# Patient Record
Sex: Male | Born: 1955 | Race: Black or African American | Hispanic: No | Marital: Married | Smoking: Former smoker
Health system: Southern US, Community
[De-identification: ages and names within clinical notes are randomized; demographics above are authoritative.]

## PROBLEM LIST (undated history)

## (undated) DIAGNOSIS — I10 Essential (primary) hypertension: Secondary | ICD-10-CM

## (undated) DIAGNOSIS — I5021 Acute systolic (congestive) heart failure: Secondary | ICD-10-CM

## (undated) DIAGNOSIS — Z7901 Long term (current) use of anticoagulants: Secondary | ICD-10-CM

## (undated) DIAGNOSIS — I4892 Unspecified atrial flutter: Secondary | ICD-10-CM

## (undated) HISTORY — DX: Unspecified atrial flutter: I48.92

## (undated) HISTORY — DX: Long term (current) use of anticoagulants: Z79.01

## (undated) HISTORY — DX: Acute systolic (congestive) heart failure: I50.21

---

## 2008-02-06 ENCOUNTER — Emergency Department (HOSPITAL_COMMUNITY): Admission: EM | Admit: 2008-02-06 | Discharge: 2008-02-06 | Payer: Self-pay | Admitting: Emergency Medicine

## 2008-02-18 ENCOUNTER — Emergency Department (HOSPITAL_COMMUNITY): Admission: EM | Admit: 2008-02-18 | Discharge: 2008-02-18 | Payer: Self-pay | Admitting: Emergency Medicine

## 2010-05-29 HISTORY — PX: FACIAL LACERATION REPAIR: SHX6589

## 2010-06-11 ENCOUNTER — Inpatient Hospital Stay (HOSPITAL_COMMUNITY)
Admission: EM | Admit: 2010-06-11 | Discharge: 2010-06-12 | Payer: Self-pay | Source: Home / Self Care | Attending: General Surgery | Admitting: General Surgery

## 2010-06-13 LAB — POCT I-STAT, CHEM 8
BUN: 18 mg/dL (ref 6–23)
Calcium, Ion: 0.99 mmol/L — ABNORMAL LOW (ref 1.12–1.32)
Chloride: 107 mEq/L (ref 96–112)
Creatinine, Ser: 1.6 mg/dL — ABNORMAL HIGH (ref 0.4–1.5)
Glucose, Bld: 114 mg/dL — ABNORMAL HIGH (ref 70–99)
HCT: 41 % (ref 39.0–52.0)
Hemoglobin: 13.9 g/dL (ref 13.0–17.0)
Potassium: 3.5 mEq/L (ref 3.5–5.1)
Sodium: 138 mEq/L (ref 135–145)
TCO2: 21 mmol/L (ref 0–100)

## 2010-06-13 LAB — BASIC METABOLIC PANEL
BUN: 10 mg/dL (ref 6–23)
CO2: 23 mEq/L (ref 19–32)
Calcium: 7.3 mg/dL — ABNORMAL LOW (ref 8.4–10.5)
Chloride: 110 mEq/L (ref 96–112)
Creatinine, Ser: 1.1 mg/dL (ref 0.4–1.5)
GFR calc Af Amer: >60 mL/min (ref >60)
GFR calc non Af Amer: >60 mL/min (ref >60)
Glucose, Bld: 102 mg/dL — ABNORMAL HIGH (ref 70–99)
Potassium: 3.6 mEq/L (ref 3.5–5.1)
Sodium: 137 mEq/L (ref 135–145)

## 2010-06-13 LAB — COMPREHENSIVE METABOLIC PANEL
ALT: 22 U/L (ref 0–53)
AST: 24 U/L (ref 0–37)
Albumin: 4.1 g/dL (ref 3.5–5.2)
Alkaline Phosphatase: 75 U/L (ref 39–117)
BUN: 16 mg/dL (ref 6–23)
CO2: 22 mEq/L (ref 19–32)
Calcium: 8.9 mg/dL (ref 8.4–10.5)
Chloride: 106 mEq/L (ref 96–112)
Creatinine, Ser: 1.23 mg/dL (ref 0.4–1.5)
GFR calc Af Amer: >60 mL/min (ref >60)
GFR calc non Af Amer: >60 mL/min (ref >60)
Glucose, Bld: 116 mg/dL — ABNORMAL HIGH (ref 70–99)
Potassium: 3.6 mEq/L (ref 3.5–5.1)
Sodium: 139 mEq/L (ref 135–145)
Total Bilirubin: 0.3 mg/dL (ref 0.3–1.2)
Total Protein: 7 g/dL (ref 6.0–8.3)

## 2010-06-13 LAB — CBC
HCT: 38.9 % — ABNORMAL LOW (ref 39.0–52.0)
HCT: 25.9 % — ABNORMAL LOW (ref 39.0–52.0)
Hemoglobin: 12.9 g/dL — ABNORMAL LOW (ref 13.0–17.0)
Hemoglobin: 8.8 g/dL — ABNORMAL LOW (ref 13.0–17.0)
MCH: 30.7 pg (ref 26.0–34.0)
MCH: 31.1 pg (ref 26.0–34.0)
MCHC: 33.2 g/dL (ref 30.0–36.0)
MCHC: 34 g/dL (ref 30.0–36.0)
MCV: 92.6 fL (ref 78.0–100.0)
MCV: 91.5 fL (ref 78.0–100.0)
Platelets: 353 10*3/uL (ref 150–400)
Platelets: 204 10*3/uL (ref 150–400)
RBC: 4.2 MIL/uL — ABNORMAL LOW (ref 4.22–5.81)
RBC: 2.83 MIL/uL — ABNORMAL LOW (ref 4.22–5.81)
RDW: 12.7 % (ref 11.5–15.5)
RDW: 13.1 % (ref 11.5–15.5)
WBC: 7.6 10*3/uL (ref 4.0–10.5)
WBC: 10.5 10*3/uL (ref 4.0–10.5)

## 2010-06-13 LAB — ABO/RH: ABO/RH(D): O POS

## 2010-06-13 LAB — HEMOGLOBIN AND HEMATOCRIT, BLOOD
HCT: 25 % — ABNORMAL LOW (ref 39.0–52.0)
Hemoglobin: 8.6 g/dL — ABNORMAL LOW (ref 13.0–17.0)

## 2010-06-13 LAB — PROTIME-INR
INR: 1.04 (ref 0.00–1.49)
Prothrombin Time: 13.8 seconds (ref 11.6–15.2)

## 2010-06-13 LAB — LACTIC ACID, PLASMA: Lactic Acid, Venous: 2.4 mmol/L — ABNORMAL HIGH (ref 0.5–2.2)

## 2010-06-15 LAB — TYPE AND SCREEN
ABO/RH(D): O POS
Antibody Screen: NEGATIVE
Unit division: 0
Unit division: 0
Unit division: 0

## 2010-06-20 LAB — POCT I-STAT 4, (NA,K, GLUC, HGB,HCT)
Glucose, Bld: 104 mg/dL — ABNORMAL HIGH (ref 70–99)
HCT: 26 % — ABNORMAL LOW (ref 39.0–52.0)
Hemoglobin: 8.8 g/dL — ABNORMAL LOW (ref 13.0–17.0)
Potassium: 3.5 mEq/L (ref 3.5–5.1)
Sodium: 141 mEq/L (ref 135–145)
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
## 2017-03-29 DIAGNOSIS — Z7901 Long term (current) use of anticoagulants: Secondary | ICD-10-CM

## 2017-03-29 DIAGNOSIS — I4892 Unspecified atrial flutter: Secondary | ICD-10-CM | POA: Insufficient documentation

## 2017-03-29 HISTORY — DX: Unspecified atrial flutter: I48.92

## 2017-03-29 HISTORY — DX: Long term (current) use of anticoagulants: Z79.01

## 2017-04-24 ENCOUNTER — Other Ambulatory Visit: Payer: Self-pay

## 2017-04-24 ENCOUNTER — Inpatient Hospital Stay (HOSPITAL_COMMUNITY)
Admission: EM | Admit: 2017-04-24 | Discharge: 2017-04-29 | DRG: 917 | Disposition: A | Discharge status: Home / Self Care | Payer: Self-pay | Attending: Internal Medicine | Admitting: Internal Medicine

## 2017-04-24 ENCOUNTER — Encounter (HOSPITAL_COMMUNITY): Payer: Self-pay

## 2017-04-24 ENCOUNTER — Emergency Department (HOSPITAL_COMMUNITY): Payer: Self-pay

## 2017-04-24 DIAGNOSIS — I4891 Unspecified atrial fibrillation: Secondary | ICD-10-CM | POA: Diagnosis present

## 2017-04-24 DIAGNOSIS — I161 Hypertensive emergency: Secondary | ICD-10-CM

## 2017-04-24 DIAGNOSIS — I5021 Acute systolic (congestive) heart failure: Secondary | ICD-10-CM

## 2017-04-24 DIAGNOSIS — T405X1A Poisoning by cocaine, accidental (unintentional), initial encounter: Principal | ICD-10-CM | POA: Diagnosis present

## 2017-04-24 DIAGNOSIS — N179 Acute kidney failure, unspecified: Secondary | ICD-10-CM | POA: Diagnosis present

## 2017-04-24 DIAGNOSIS — I21A1 Myocardial infarction type 2: Secondary | ICD-10-CM | POA: Diagnosis present

## 2017-04-24 DIAGNOSIS — I4892 Unspecified atrial flutter: Secondary | ICD-10-CM

## 2017-04-24 DIAGNOSIS — I248 Other forms of acute ischemic heart disease: Secondary | ICD-10-CM

## 2017-04-24 DIAGNOSIS — R0602 Shortness of breath: Secondary | ICD-10-CM

## 2017-04-24 DIAGNOSIS — I5043 Acute on chronic combined systolic (congestive) and diastolic (congestive) heart failure: Secondary | ICD-10-CM | POA: Diagnosis present

## 2017-04-24 DIAGNOSIS — I255 Ischemic cardiomyopathy: Secondary | ICD-10-CM | POA: Diagnosis present

## 2017-04-24 DIAGNOSIS — I110 Hypertensive heart disease with heart failure: Secondary | ICD-10-CM | POA: Diagnosis present

## 2017-04-24 DIAGNOSIS — I160 Hypertensive urgency: Secondary | ICD-10-CM | POA: Diagnosis present

## 2017-04-24 DIAGNOSIS — J9601 Acute respiratory failure with hypoxia: Secondary | ICD-10-CM | POA: Diagnosis present

## 2017-04-24 DIAGNOSIS — I214 Non-ST elevation (NSTEMI) myocardial infarction: Secondary | ICD-10-CM

## 2017-04-24 DIAGNOSIS — Z8249 Family history of ischemic heart disease and other diseases of the circulatory system: Secondary | ICD-10-CM

## 2017-04-24 DIAGNOSIS — R739 Hyperglycemia, unspecified: Secondary | ICD-10-CM | POA: Diagnosis present

## 2017-04-24 DIAGNOSIS — J810 Acute pulmonary edema: Secondary | ICD-10-CM

## 2017-04-24 DIAGNOSIS — I2510 Atherosclerotic heart disease of native coronary artery without angina pectoris: Secondary | ICD-10-CM | POA: Diagnosis present

## 2017-04-24 DIAGNOSIS — I429 Cardiomyopathy, unspecified: Secondary | ICD-10-CM

## 2017-04-24 DIAGNOSIS — I4581 Long QT syndrome: Secondary | ICD-10-CM | POA: Diagnosis present

## 2017-04-24 DIAGNOSIS — Q211 Atrial septal defect: Secondary | ICD-10-CM

## 2017-04-24 DIAGNOSIS — D649 Anemia, unspecified: Secondary | ICD-10-CM | POA: Diagnosis present

## 2017-04-24 DIAGNOSIS — J811 Chronic pulmonary edema: Secondary | ICD-10-CM

## 2017-04-24 DIAGNOSIS — F17210 Nicotine dependence, cigarettes, uncomplicated: Secondary | ICD-10-CM | POA: Diagnosis present

## 2017-04-24 DIAGNOSIS — F1410 Cocaine abuse, uncomplicated: Secondary | ICD-10-CM | POA: Diagnosis present

## 2017-04-24 HISTORY — DX: Essential (primary) hypertension: I10

## 2017-04-24 HISTORY — DX: Acute systolic (congestive) heart failure: I50.21

## 2017-04-24 LAB — URINALYSIS, ROUTINE W REFLEX MICROSCOPIC
Bilirubin Urine: NEGATIVE
Glucose, UA: NEGATIVE mg/dL
Hgb urine dipstick: NEGATIVE
KETONES UR: NEGATIVE mg/dL
LEUKOCYTES UA: NEGATIVE
NITRITE: NEGATIVE
PROTEIN: NEGATIVE mg/dL
Specific Gravity, Urine: 1.009 (ref 1.005–1.030)
pH: 5 (ref 5.0–8.0)

## 2017-04-24 LAB — BASIC METABOLIC PANEL
Anion gap: 7 (ref 5–15)
BUN: 17 mg/dL (ref 6–20)
CO2: 22 mmol/L (ref 22–32)
Calcium: 8.8 mg/dL — ABNORMAL LOW (ref 8.9–10.3)
Chloride: 109 mmol/L (ref 101–111)
Creatinine, Ser: 1.26 mg/dL — ABNORMAL HIGH (ref 0.61–1.24)
GFR calc Af Amer: >60 mL/min (ref >60)
GFR calc non Af Amer: >60 mL/min (ref >60)
Glucose, Bld: 131 mg/dL — ABNORMAL HIGH (ref 65–99)
Potassium: 3.7 mmol/L (ref 3.5–5.1)
Sodium: 138 mmol/L (ref 135–145)

## 2017-04-24 LAB — RAPID URINE DRUG SCREEN, HOSP PERFORMED
Amphetamines: NOT DETECTED
BENZODIAZEPINES: NOT DETECTED
Barbiturates: NOT DETECTED
COCAINE: POSITIVE — AB
OPIATES: NOT DETECTED
Tetrahydrocannabinol: NOT DETECTED

## 2017-04-24 LAB — I-STAT TROPONIN, ED: Troponin i, poc: 1.18 ng/mL (ref 0.00–0.08)

## 2017-04-24 LAB — HEPATIC FUNCTION PANEL
ALT: 28 U/L (ref 17–63)
AST: 34 U/L (ref 15–41)
Albumin: 3.3 g/dL — ABNORMAL LOW (ref 3.5–5.0)
Alkaline Phosphatase: 73 U/L (ref 38–126)
BILIRUBIN INDIRECT: 0.5 mg/dL (ref 0.3–0.9)
Bilirubin, Direct: 0.1 mg/dL (ref 0.1–0.5)
TOTAL PROTEIN: 6.6 g/dL (ref 6.5–8.1)
Total Bilirubin: 0.6 mg/dL (ref 0.3–1.2)

## 2017-04-24 LAB — CBC
HCT: 37.1 % — ABNORMAL LOW (ref 39.0–52.0)
Hemoglobin: 12.3 g/dL — ABNORMAL LOW (ref 13.0–17.0)
MCH: 30.7 pg (ref 26.0–34.0)
MCHC: 33.2 g/dL (ref 30.0–36.0)
MCV: 92.5 fL (ref 78.0–100.0)
Platelets: 459 10*3/uL — ABNORMAL HIGH (ref 150–400)
RBC: 4.01 MIL/uL — ABNORMAL LOW (ref 4.22–5.81)
RDW: 12.9 % (ref 11.5–15.5)
WBC: 9.5 10*3/uL (ref 4.0–10.5)

## 2017-04-24 LAB — TSH: TSH: 0.564 u[IU]/mL (ref 0.350–4.500)

## 2017-04-24 LAB — MAGNESIUM: Magnesium: 2 mg/dL (ref 1.7–2.4)

## 2017-04-24 LAB — BRAIN NATRIURETIC PEPTIDE: B NATRIURETIC PEPTIDE 5: 396.4 pg/mL — AB (ref 0.0–100.0)

## 2017-04-24 MED ORDER — HEPARIN (PORCINE) IN NACL 100-0.45 UNIT/ML-% IJ SOLN
1700.0000 [IU]/h | INTRAMUSCULAR | Status: DC
  Administered 2017-04-24: 1300 [IU]/h via INTRAVENOUS
  Administered 2017-04-25: 1600 [IU]/h via INTRAVENOUS
  Filled 2017-04-24 (×3): qty 250

## 2017-04-24 MED ORDER — FUROSEMIDE 10 MG/ML IJ SOLN
20.0000 mg | Freq: Once | INTRAMUSCULAR | Status: AC
  Administered 2017-04-25: 20 mg via INTRAVENOUS
  Filled 2017-04-24: qty 2

## 2017-04-24 MED ORDER — ALBUTEROL SULFATE (2.5 MG/3ML) 0.083% IN NEBU
5.0000 mg | INHALATION_SOLUTION | Freq: Once | RESPIRATORY_TRACT | Status: AC
Start: 2017-04-24 — End: 2017-04-24
  Administered 2017-04-24: 5 mg via RESPIRATORY_TRACT
  Filled 2017-04-24: qty 6

## 2017-04-24 MED ORDER — FUROSEMIDE 10 MG/ML IJ SOLN
20.0000 mg | Freq: Once | INTRAMUSCULAR | Status: AC
  Administered 2017-04-24: 20 mg via INTRAVENOUS
  Filled 2017-04-24: qty 2

## 2017-04-24 MED ORDER — NITROGLYCERIN 2 % TD OINT
1.0000 [in_us] | TOPICAL_OINTMENT | Freq: Four times a day (QID) | TRANSDERMAL | Status: DC
  Administered 2017-04-24: 1 [in_us] via TOPICAL
  Filled 2017-04-24: qty 1

## 2017-04-24 MED ORDER — DILTIAZEM LOAD VIA INFUSION
15.0000 mg | Freq: Once | INTRAVENOUS | Status: AC
  Administered 2017-04-24: 15 mg via INTRAVENOUS
  Filled 2017-04-24: qty 15

## 2017-04-24 MED ORDER — ASPIRIN 81 MG PO CHEW
324.0000 mg | CHEWABLE_TABLET | Freq: Once | ORAL | Status: DC
  Filled 2017-04-24: qty 4

## 2017-04-24 MED ORDER — LEVALBUTEROL HCL 0.63 MG/3ML IN NEBU: 0.6300 mg | INHALATION_SOLUTION | Freq: Four times a day (QID) | RESPIRATORY_TRACT | Status: DC | PRN

## 2017-04-24 MED ORDER — NITROGLYCERIN IN D5W 200-5 MCG/ML-% IV SOLN
0.0000 ug/min | Freq: Once | INTRAVENOUS | Status: AC
  Administered 2017-04-24: 10 ug/min via INTRAVENOUS
  Filled 2017-04-24: qty 250

## 2017-04-24 MED ORDER — DILTIAZEM HCL 100 MG IV SOLR
5.0000 mg/h | INTRAVENOUS | Status: DC
  Administered 2017-04-24: 5 mg/h via INTRAVENOUS
  Filled 2017-04-24 (×4): qty 100

## 2017-04-24 MED ORDER — HEPARIN BOLUS VIA INFUSION
5000.0000 [IU] | Freq: Once | INTRAVENOUS | Status: AC
  Administered 2017-04-24: 5000 [IU] via INTRAVENOUS
  Filled 2017-04-24: qty 5000

## 2017-04-24 NOTE — ED Notes (Signed)
Message sent to pharmacy for Cardizem

## 2017-04-24 NOTE — ED Notes (Signed)
Lab to add on Magnesium 

## 2017-04-24 NOTE — ED Notes (Signed)
Writer notified EPD Pfeiffer of abnormal I-stat trop result.

## 2017-04-24 NOTE — ED Notes (Signed)
Updated Burna Mortimer, pt's wife, on plan of care per permission of pt

## 2017-04-24 NOTE — H&P (Signed)
PULMONARY / CRITICAL CARE MEDICINE   Name: Roger Humphrey: [id number] DOB: 03/28/1956    ADMISSION DATE:  04/24/2017 CONSULTATION DATE:  04/24/2017  REFERRING MD:  Dr. Donnald GarrePfeiffer  CHIEF COMPLAINT:  SOB/HTN crisis  HISTORY OF PRESENT ILLNESS:   Roger Humphrey is a 61 year old male with no known past medical history other than tobacco abuse.  He has not seen a physician in "years".  Presented to the ER on 11/27 with worsening shortness of breath since last night.    He reports that he socially drinks beer and usually does not do illicit drugs but smoked marijuana on Thanksgiving night.  He said that his friends did not tell him till afterwards that it was laced with cocaine.  Since, he has felt palpitations noticed that he was could not catch his breath laying down last night with occasional dry cough.  Today it continued, along with intermittent "cramps" in his left upper chest/axilla area, sweats, and acutely worsened after going to [employer].  Denies fever, chills, productive cough or hemoptysis, lower extremity swelling or pain, or syncope.  He was found to be in Aflutter with acute respiratory distress per EMS and treated with cardizem, albuterol/atrovent, and noted to be 85% on room air.  In the ER, BP 176/110, aflutter with rate 120's, ongoing severe dyspnea with rales requiring NIMV.  CXR consistent with acute pulmonary edema.  EKG showing aflutter.  Labs noted for BNP 396, troponin 1.18, sCr 1.26, and UDS + cocaine.  Cardiology consulted by EDP and started on NTG, heparin, and Cardizem gtt.  PCCM asked to admit.   PAST MEDICAL HISTORY :  He  has no past medical history on file.  PAST SURGICAL HISTORY: He  has no past surgical history on file.  No Known Allergies  No current facility-administered medications on file prior to encounter.    Current Outpatient Medications on File Prior to Encounter  Medication Sig  . Multiple Vitamin (MULTIVITAMIN WITH MINERALS) TABS tablet Take 1  tablet by mouth daily.    FAMILY HISTORY:  His has no family status information on file.    SOCIAL HISTORY: He  reports that he has been smoking.  He does not have any smokeless tobacco history on file.  Smokes 2 cigarettes a day for at least 36 years  REVIEW OF SYSTEMS:  POSITIVES IN BOLD Gen: Denies fever, chills, weight change, fatigue, night sweats HEENT: Denies blurred vision, double vision, hearing loss, tinnitus, sinus congestion, rhinorrhea, sore throat, neck stiffness, dysphagia PULM: Denies shortness of breath, dry cough, sputum production, hemoptysis, wheezing CV: Denies chest pain, edema, orthopnea, paroxysmal nocturnal dyspnea, palpitations, diaphoresis GI: Denies abdominal pain, nausea, vomiting, diarrhea, hematochezia, melena, constipation, change in bowel habits GU: Denies dysuria, hematuria, polyuria, oliguria, urethral discharge Endocrine: Denies hot or cold intolerance, polyuria, polyphagia or appetite change Derm: Denies rash, dry skin, scaling or peeling skin change Heme: Denies easy bruising, bleeding, bleeding gums Neuro: Denies headache, numbness, weakness, slurred speech, loss of memory or consciousness   SUBJECTIVE:  No complaints at present.  Feels better on BiPAP.    VITAL SIGNS: BP (!) 168/106   Pulse (!) 117   Temp 98.7 F (37.1 C) (Oral)   Resp (!) 30   Ht 5\' 8"  (1.727 m)   Wt 214 lb (97.1 kg)   SpO2 98%   BMI 32.54 kg/m   HEMODYNAMICS:    VENTILATOR SETTINGS: FiO2 (%):  [50 %] 50 %  INTAKE / OUTPUT: No intake/output data recorded.  PHYSICAL EXAMINATION: General:  Adult male sitting upright on ER stretcher in no acute distress HEENT: +JVD, pupils 3/=/round Neuro: AOx 3, MAE, non-focal CV:  IRIR 120's, +2 warm distal pulses PULM: even/non-labored on NIMV 10/5, 50%, lungs bilaterally clear anteriorly, posterior bibasilar rales R> L, speaking full sentences GI: obese, soft, non-tender, bs active  Extremities: warm/dry, trace BLE edema   Skin: no rashes  LABS:  BMET Recent Labs  Lab 04/24/17 2011  NA 138  K 3.7  CL 109  CO2 22  BUN 17  CREATININE 1.26*  GLUCOSE 131*    Electrolytes Recent Labs  Lab 04/24/17 2011  CALCIUM 8.8*  MG 2.0    CBC Recent Labs  Lab 04/24/17 2011  WBC 9.5  HGB 12.3*  HCT 37.1*  PLT 459*    Coag's No results for input(s): APTT, INR in the last 168 hours.  Sepsis Markers No results for input(s): LATICACIDVEN, PROCALCITON, O2SATVEN in the last 168 hours.  ABG No results for input(s): PHART, PCO2ART, PO2ART in the last 168 hours.  Liver Enzymes Recent Labs  Lab 04/24/17 2011  AST 34  ALT 28  ALKPHOS 73  BILITOT 0.6  ALBUMIN 3.3*    Cardiac Enzymes No results for input(s): TROPONINI, PROBNP in the last 168 hours.  Glucose No results for input(s): GLUCAP in the last 168 hours.  Imaging Dg Chest Port 1 View  Result Date: 04/24/2017 CLINICAL DATA:  Severe shortness of breath. EXAM: PORTABLE CHEST 1 VIEW COMPARISON:  Chest x-ray dated June 11, 2010. FINDINGS: Mild cardiomegaly. Normal pulmonary vascularity. Diffuse consolidative opacities in the right greater than left lungs. Small right pleural effusion. No pneumothorax. No acute osseous abnormality. IMPRESSION: 1. Diffuse consolidative opacities in the right greater than left lungs, which could reflect ARDS, edema, or hemorrhage. Electronically Signed   By: Obie Dredge M.D.   On: 04/24/2017 20:43   STUDIES:  CXR 04/24/2017 >> Diffuse consolidative opacities in the right greater than left lungs, which could reflect ARDS, edema, or hemorrhage  EKG 11/27 >> Aflutter, rate 130, prolonged QTc .481, probable LVH  CULTURES: MRSA PCR 11/27 >>  ANTIBIOTICS: none  SIGNIFICANT EVENTS: 11/27 Admit  LINES/TUBES: PIV x 2  DISCUSSION: 25 yoM with no known PMH except tobacco abuse but doesnt see a doctor presenting with severe SOB- progressed since last night, palpitations, diaphoresis, and intermittent  CP.  Smoked marijuana on Thanksgiving and did not know it was laced with cocaine.  Found hypertensive, Aflutter 130's, and hypoxic requiring NIMV, NGT and Cardizem gtt, and lasix.  Cards consulting.    ASSESSMENT / PLAN:  PULMONARY A: Acute hypoxic respiratory failure secondary to acute pulmonary edema Tobacco abuse  P:   Continue NIMV for now, trial off in 4 hrs and then PRN S/p lasix 20 mg On NTG gtt for BP Wean supplemental O2 for sats >94% xopenex q 6 hr prn Ongoing tobacco cessation counseling  CARDIOVASCULAR A:  Hypertensive Emergency with acute pulmonary edema and new onset Aflutter with positive troponin + Cocaine use - BNP 396, troponin 1.18 P:  Tele monitoring Appreciate Cardiology's assistance Heparin, NTG, and Cardizem gtt per Cards Assess TTE when rate is better controlled Trend troponin and EKG No beta blocker with recent cocaine use Assess Mg level  RENAL A:   AKI- previous sCr 1.10 (05/2010) P:   S/p Lasix 20mg  x 1, repeat at 0800 Trend BMP /mag/  urinary output/ daily weights Replace electrolytes as indicated Avoid nephrotoxic agents, ensure adequate renal perfusion  GASTROINTESTINAL A:   No acute issues - LFTs wnl P:   NPO while on NIMV Does not meet SUP criteria  HEMATOLOGIC A:   Anemia- mild P:  Trend CBC Systemic heparin gtt as above, SCDs for VTE ppx  INFECTIOUS A:   No known acute process - afebrile, normal WBC P:   Trend WBC/ fever curve  ENDOCRINE A:   Hyperglycemia- mild - TSH 0.564 P:   CBG q 4 Add SSI if glucose > 150 Assess HgbA1c  NEUROLOGIC A:   No acute issues, non-focal neuro exam - ETOH use- socially P:   Monitor  FAMILY  - Updates:  No family at bedside.  Patient updated concerning plan of care.   - Inter-disciplinary family meet or Palliative Care meeting due by:  05/01/2017  CCT 40 mins  Posey Boyer, AGACNP-BC Pulmonary and Critical Care Medicine [facility] Pager: [phone]  04/24/2017, 11:11 PM

## 2017-04-24 NOTE — ED Provider Notes (Signed)
[facility] EMERGENCY DEPARTMENT Provider Note   CSN: [id number] Arrival date & time: 04/24/17  2001     History   Chief Complaint Chief Complaint  Patient presents with  . Shortness of Breath    HPI Roger Humphrey is a 61 y.o. male.  HPI Patient denies past medical history.  He does have smoking history.  He reports he started getting short of breath fairly quickly this morning at about 5 AM.  He reports he has been having some waxing and waning feeling of tightness in his left upper chest.  He reports that the difficulty breathing got much worse after he went out and did some errands.  He reports he got to the point that he felt like he just could not catch his breath at all.  Denies he has had recent fever, cough or chills.  No lower extremity swelling or calf pain.  No recent long travel or history of DVT.  She reports he is last cigarette was 72 hours ago.  No drug or alcohol use. No past medical history on file.  There are no active problems to display for this patient.   Past medical history No known past medical history  Social history: Positive tobacco use, positive cocaine use     Home Medications    Prior to Admission medications   Not on File    Family History No family history on file.  Social History Social History   Tobacco Use  . Smoking status: Current Every Day Smoker  Substance Use Topics  . Alcohol use: Not on file  . Drug use: Not on file     Allergies   Patient has no allergy information on record.   Review of Systems Review of Systems 10 Systems reviewed and are negative for acute change except as noted in the HPI.   Physical Exam Updated Vital Signs BP (!) 185/123   Pulse (!) 123   Temp 98.7 F (37.1 C) (Oral)   Resp (!) 38   Ht 5\' 8"  (1.727 m)   Wt 97.1 kg (214 lb)   SpO2 100%   BMI 32.54 kg/m   Physical Exam  Constitutional: He is oriented to person, place, and time.  Patient is alert and  oriented.  He does have moderate to severe respiratory distress.  Speaking in short full sentences.  HENT:  Head: Normocephalic and atraumatic.  Eyes: EOM are normal.  Cardiovascular:  Tachycardia.  No gross rub murmur gallop.  Pulmonary/Chest:  Moderate to severe increased work of breathing.  Speaking in short full sentences.  Coarse wheeze and rales throughout the lung fields.  Abdominal: Soft. He exhibits no distension. There is no tenderness. There is no guarding.  Musculoskeletal: Normal range of motion. He exhibits no edema or tenderness.  No peripheral edema.  No calf tenderness.  Neurological: He is alert and oriented to person, place, and time. No cranial nerve deficit. He exhibits normal muscle tone. Coordination normal.  Skin: Skin is warm.  Warm, slightly diaphoretic  Psychiatric: He has a normal mood and affect.     ED Treatments / Results  Labs (all labs ordered are listed, but only abnormal results are displayed) Labs Reviewed  CBC - Abnormal; Notable for the following components:      Result Value   RBC 4.01 (*)    Hemoglobin 12.3 (*)    HCT 37.1 (*)    Platelets 459 (*)    All other components within normal limits  I-STAT TROPONIN, ED - Abnormal; Notable for the following components:   Troponin i, poc 1.18 (*)    All other components within normal limits  BASIC METABOLIC PANEL  HEPATIC FUNCTION PANEL  BRAIN NATRIURETIC PEPTIDE  URINALYSIS, ROUTINE W REFLEX MICROSCOPIC  RAPID URINE DRUG SCREEN, HOSP PERFORMED  TSH    EKG  EKG Interpretation  Date/Time:  Tuesday April 24 2017 20:06:33 EST Ventricular Rate:  130 PR Interval:    QRS Duration: 93 QT Interval:  327 QTC Calculation: 481 R Axis:   76 Text Interpretation:  Atrial flutter Probable LVH with secondary repol abnrm Borderline prolonged QT interval Baseline wander in lead(s) II III aVF V2 agree. Confirmed by Arby BarrettePfeiffer, Demetruis Depaul [phone]([id number]) on 04/24/2017 8:15:42 PM       Radiology No results  found.  Procedures Procedures (including critical care time) CRITICAL CARE Performed by: Arby BarrettePfeiffer, Aviyah Swetz   Total critical care time:45 minutes  Critical care time was exclusive of separately billable procedures and treating other patients.  Critical care was necessary to treat or prevent imminent or life-threatening deterioration.  Critical care was time spent personally by me on the following activities: development of treatment plan with patient and/or surrogate as well as nursing, discussions with consultants, evaluation of patient's response to treatment, examination of patient, obtaining history from patient or surrogate, ordering and performing treatments and interventions, ordering and review of laboratory studies, ordering and review of radiographic studies, pulse oximetry and re-evaluation of patient's condition. Medications Ordered in ED Medications  albuterol (PROVENTIL) (2.5 MG/3ML) 0.083% nebulizer solution 5 mg (not administered)  aspirin chewable tablet 324 mg (not administered)  nitroGLYCERIN (NITROGLYN) 2 % ointment 1 inch (not administered)  furosemide (LASIX) injection 20 mg (not administered)     Initial Impression / Assessment and Plan / ED Course  I have reviewed the triage vital signs and the nursing notes.  Pertinent labs & imaging results that were available during my care of the patient were reviewed by me and considered in my medical decision making (see chart for details).    Recheck 20: 20 patient comfortable on BiPAP and improved.  Recheck 22: 40 nitroglycerin drip has been titrated to 25 mics.  Pressure is 150/ 90s.  Patient reports he feels much better.  No chest pain.  Patient still on BiPAP.  Speaks clearly through the BiPAP mask.  Complete sentences without difficulty. Consult: Has been seen by cardiology fellow Dr. Electa SniffBarnett.  Suspects troponin is more likely demand ischemia.  Agrees with initiating heparin for atrial fib flutter.  Continue to  titrate nitroglycerin for blood pressure. Consult: Reviewed with Dr. Darrick Pennaeterding for admission.  Final Clinical Impressions(s) / ED Diagnoses   Final diagnoses:  Flash pulmonary edema (HCC)  Atrial flutter, unspecified type [facility])   Patient presents as outlined above.  He denies past medical history.  Findings most consistent with flash pulmonary edema.  Patient was significantly hypertensive.  Hypertensive emergency versus cardiac ischemia as the underlying etiology.  Patient also is cocaine positive.  May be a contributor.  Patient has responded well to treatment.  She will be admitted to ICU for ongoing management with consultation to cardiology. ED Discharge Orders    None       Arby BarrettePfeiffer, Melis Trochez, MD 04/24/17 2246

## 2017-04-24 NOTE — ED Notes (Signed)
Cardiologist at bedside.  

## 2017-04-24 NOTE — Consult Note (Signed)
Cardiology Consultation:   Patient ID: Roger Humphrey; [id number]; 12-20-1955   Admit date: 04/24/2017 Date of Consult: 04/24/2017  Primary Care Provider: Patient, No Pcp Per Primary Cardiologist: n/a Primary Electrophysiologist:  n/a  Chief Complaint: shortness of breath  Patient Profile:   Roger Humphrey is a 61 y.o. male with no past medical history who presents with shortness of breath.  History of Present Illness:   Patient states he has been feeling fine until last night when he trouble breathing and felt nauseous.  He then felt better after going outside in the cool air.  Then today he had shortness of breath off and on all day.  He eventually got to the point where he felt like he could not breathe and came to the emergency room.  He denies any chest pain or pressure with the symptoms.  In the emergency room he was noted to have a blood pressure of 190/121.  He was also found to be in atrial flutter/fibrillation with a heart rate in the 120s.  EKG also notable for T wave inversions in V5 and V6.  Chest x-ray demonstrated pulmonary edema.  Labs are notable for a troponin of 1.18 BNP of 396 and creatinine of 1.26.    He was placed on BiPAP and given 20 mg IV Lasix.  Nitroglycerin infusion was started for hypertensive emergency.  He improved with these measures.  He denies having any prior medical problems.  He does not follow with a doctor regularly.  He takes no medications.  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  He denies any symptoms climbing up ladders and going up stairs over the past several weeks.  Specifically he denies any chest pain or pressure.  He does admit to occasionally using marijuana and cocaine. He last used cocaine on Thanksgiving.    Past medical History None  Past Surgical History None  Family History No relevant family history  Social history He is a smoker and smokes about 1 pack every 2 weeks since the age of 53.  Uses  marijuana and cocaine.  Drinks 4 beers per week.  Is married and lives with his wife.  Works as a Education administrator.   Inpatient Medications: Scheduled Meds: . aspirin  324 mg Oral Once   Continuous Infusions: . diltiazem (CARDIZEM) infusion 5 mg/hr (04/24/17 2326)  . heparin 1,300 Units/hr (04/24/17 2219)   PRN Meds:   Home Meds: Prior to Admission medications   Medication Sig Start Date End Date Taking? Authorizing Provider  Multiple Vitamin (MULTIVITAMIN WITH MINERALS) TABS tablet Take 1 tablet by mouth daily.   Yes [provider]    Allergies:   No Known Allergies  Social History:   Social History   Socioeconomic History  . Marital status: Married    Spouse name: Not on file  . Number of children: Not on file  . Years of education: Not on file  . Highest education level: Not on file  Social Needs  . Financial resource strain: Not on file  . Food insecurity - worry: Not on file  . Food insecurity - inability: Not on file  . Transportation needs - medical: Not on file  . Transportation needs - non-medical: Not on file  Occupational History  . Not on file  Tobacco Use  . Smoking status: Current Every Day Smoker  Substance and Sexual Activity  . Alcohol use: Not on file  . Drug use: Not on file  . Sexual activity:  Not on file  Other Topics Concern  . Not on file  Social History Narrative  . Not on file     ROS:  Please see the history of present illness.  All other ROS reviewed and negative.     Physical Exam/Data:   Vitals:   04/24/17 2230 04/24/17 2245 04/24/17 2300 04/24/17 2315  BP: (!) 151/109 (!) 154/117 (!) 168/106 (!) 143/99  Pulse: (!) 57 (!) 113 (!) 117 (!) 116  Resp: (!) 29 (!) 29 (!) 30 (!) 22  Temp:      TempSrc:      SpO2: 98% 100% 98% 100%  Weight:      Height:        Intake/Output Summary (Last 24 hours) at 04/24/2017 2331 Last data filed at 04/24/2017 2204 Gross per 24 hour  Intake -  Output 1200 ml  Net -1200 ml   Filed  Weights   04/24/17 2008  Weight: 97.1 kg (214 lb)   Body mass index is 32.54 kg/m.  General: Well developed, well nourished, in no acute distress. Head: Normocephalic, atraumatic, sclera non-icteric, no xanthomas, nares are without discharge.  Neck: Negative for carotid bruits. JVD not elevated. Lungs: Clear bilaterally to auscultation without wheezes, rales, or rhonchi. Breathing is unlabored. Heart: RRR with S1 S2. No murmurs, rubs, or gallops appreciated. Abdomen: Soft, non-tender, non-distended with normoactive bowel sounds. No hepatomegaly. No rebound/guarding. No obvious abdominal masses. Msk:  Strength and tone appear normal for age. Extremities: No clubbing or cyanosis. No edema.  Distal pedal pulses are 2+ and equal bilaterally. Neuro: Alert and oriented X 3. No facial asymmetry. No focal deficit. Moves all extremities spontaneously. Psych:  Responds to questions appropriately with a normal affect.  EKG:  The EKG was personally reviewed and demonstrates afib/aflutter.  Relevant CV Studies: None  Laboratory Data:  Chemistry Recent Labs  Lab 04/24/17 2011  NA 138  K 3.7  CL 109  CO2 22  GLUCOSE 131*  BUN 17  CREATININE 1.26*  CALCIUM 8.8*  GFRNONAA >60  GFRAA >60  ANIONGAP 7    Recent Labs  Lab 04/24/17 2011  PROT 6.6  ALBUMIN 3.3*  AST 34  ALT 28  ALKPHOS 73  BILITOT 0.6   Hematology Recent Labs  Lab 04/24/17 2011  WBC 9.5  RBC 4.01*  HGB 12.3*  HCT 37.1*  MCV 92.5  MCH 30.7  MCHC 33.2  RDW 12.9  PLT 459*   Cardiac EnzymesNo results for input(s): TROPONINI in the last 168 hours.  Recent Labs  Lab 04/24/17 2024  TROPIPOC 1.18*    BNP Recent Labs  Lab 04/24/17 2011  BNP 396.4*    DDimer No results for input(s): DDIMER in the last 168 hours.  Radiology/Studies:  Dg Chest Port 1 View  Result Date: 04/24/2017 CLINICAL DATA:  Severe shortness of breath. EXAM: PORTABLE CHEST 1 VIEW COMPARISON:  Chest x-ray dated June 11, 2010.  FINDINGS: Mild cardiomegaly. Normal pulmonary vascularity. Diffuse consolidative opacities in the right greater than left lungs. Small right pleural effusion. No pneumothorax. No acute osseous abnormality. IMPRESSION: 1. Diffuse consolidative opacities in the right greater than left lungs, which could reflect ARDS, edema, or hemorrhage. Electronically Signed   By: Obie Dredge M.D.   On: 04/24/2017 20:43    Assessment and Plan:   1. Hypertensive Emergency Patient to be admitted by medicinefor further management of hypertensive emergency with pulmonary edema.  2. Atrial fibrillation vs flutter EKG is suggestive of flutter with flutter waves  in the inferior leads.  However the RR intervals are fairly irregular and this may actually be A. Fib.  Regardless would focus on managing his pulmonary edema and hypertension for the time being his heart rates are in the 120s and would not aggressively rate control him.  I suspect his rates will improve as his respiratory status improves.  Can consider a diltiazem infusion if additional rate control as needed.  Low suspicion for cardiogenic shock.  If he remains in this rhythm after he has recovered from his respiratory failure we can consider cardioversion prior to discharge.  Agree with starting heparin to provide anticoagulation in the event that he does need cardioversion.  - Heparin infusion - Can consider diltiazem infusion for rate control with heart rate goal of less than 110. -will consider TEE cardioversion prior to discharge  3. NSTEMI, likely type II Noted to have positive troponin without any chest pain.  Suspect his elevated troponin is due to demand ischemia in the setting of severe hypertension and atrial fibrillation.  He does have some T wave inversions on his EKG which may suggest subendocardial ischemia.    Would get an echocardiogram tomorrow to evaluate his ejection fraction.  If his EF is severely depressed we can consider an ischemic  evaluation.  - Echocardiogram -We will consider cath if EF is severely depressed  4.  Cocaine use Avoid beta-blockers if possible   For questions or updates, please contact [facility] Please consult [email or web address] for contact info under Cardiology/STEMI.    Allen DerrySigned, Buzz Axel S, MD  04/24/2017 11:31 PM

## 2017-04-24 NOTE — Progress Notes (Signed)
ANTICOAGULATION CONSULT NOTE -Consult  Pharmacy Consult for heparin Indication: atrial fibrillation  No Known Allergies  Patient Measurements: Height: 5\' 8"  (172.7 cm) Weight: 214 lb (97.1 kg) IBW/kg (Calculated) : 68.4 Heparin Dosing Weight: 89 kg  Vital Signs: Temp: 98.7 F (37.1 C) (11/27 2012) Temp Source: Oral (11/27 2012) BP: 176/110 (11/27 2100) Pulse Rate: 122 (11/27 2100)  Labs: Recent Labs    04/24/17 2011  HGB 12.3*  HCT 37.1*  PLT 459*  CREATININE 1.26*    Estimated Creatinine Clearance: 70.5 mL/min (A) (by C-G formula based on SCr of 1.26 mg/dL (H)).  Assessment: CC/HPI: 61 yo m presenting with SOB and aflutter  PMH: none?  Anticoag: none pta - iv hep for afib   Renal: SCr 1.26  Heme/Onc: H&H 12.3/37.1, Plt 459  Goal of Therapy:  Heparin level 0.3-0.7 units/ml Monitor platelets by anticoagulation protocol: Yes   Plan:  Heparin bolus 5000 units x 1 Hep gtt 1300 units/hr Initial HL with am labs F/u plans for [facility]  Isaac Bliss, PharmD, BCPS, BCCCP Clinical Pharmacist Clinical phone for 04/24/2017 from 7a-3:30p: [phone] If after 3:30p, please call main pharmacy at: [phone] 04/24/2017 9:55 PM

## 2017-04-24 NOTE — ED Notes (Signed)
Respiratory at bedside.

## 2017-04-24 NOTE — ED Notes (Signed)
Nitro paste removed; nitro gtt started

## 2017-04-24 NOTE — ED Notes (Signed)
Requested additional cardizem bag from pharmacy; accidentally punctured bag when hanging medication
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
## 2017-04-25 ENCOUNTER — Inpatient Hospital Stay (HOSPITAL_COMMUNITY): Payer: Self-pay

## 2017-04-25 DIAGNOSIS — I248 Other forms of acute ischemic heart disease: Secondary | ICD-10-CM

## 2017-04-25 DIAGNOSIS — N179 Acute kidney failure, unspecified: Secondary | ICD-10-CM

## 2017-04-25 DIAGNOSIS — F1410 Cocaine abuse, uncomplicated: Secondary | ICD-10-CM

## 2017-04-25 DIAGNOSIS — I483 Typical atrial flutter: Secondary | ICD-10-CM

## 2017-04-25 DIAGNOSIS — I160 Hypertensive urgency: Secondary | ICD-10-CM

## 2017-04-25 DIAGNOSIS — J9601 Acute respiratory failure with hypoxia: Secondary | ICD-10-CM

## 2017-04-25 DIAGNOSIS — I5031 Acute diastolic (congestive) heart failure: Secondary | ICD-10-CM

## 2017-04-25 DIAGNOSIS — J810 Acute pulmonary edema: Secondary | ICD-10-CM | POA: Insufficient documentation

## 2017-04-25 LAB — GLUCOSE, CAPILLARY
GLUCOSE-CAPILLARY: 112 mg/dL — AB (ref 65–99)
GLUCOSE-CAPILLARY: 131 mg/dL — AB (ref 65–99)
GLUCOSE-CAPILLARY: 96 mg/dL (ref 65–99)
GLUCOSE-CAPILLARY: 97 mg/dL (ref 65–99)
Glucose-Capillary: 106 mg/dL — ABNORMAL HIGH (ref 65–99)
Glucose-Capillary: 97 mg/dL (ref 65–99)
Glucose-Capillary: 103 mg/dL — ABNORMAL HIGH (ref 65–99)

## 2017-04-25 LAB — HEPARIN LEVEL (UNFRACTIONATED)
HEPARIN UNFRACTIONATED: 0.2 [IU]/mL — AB (ref 0.30–0.70)
Heparin Unfractionated: 0.29 IU/mL — ABNORMAL LOW (ref 0.30–0.70)

## 2017-04-25 LAB — RENAL FUNCTION PANEL
ANION GAP: 9 (ref 5–15)
Albumin: 3.2 g/dL — ABNORMAL LOW (ref 3.5–5.0)
BUN: 14 mg/dL (ref 6–20)
CALCIUM: 8.6 mg/dL — AB (ref 8.9–10.3)
CO2: 22 mmol/L (ref 22–32)
CREATININE: 1.18 mg/dL (ref 0.61–1.24)
Chloride: 110 mmol/L (ref 101–111)
Glucose, Bld: 114 mg/dL — ABNORMAL HIGH (ref 65–99)
Phosphorus: 4.9 mg/dL — ABNORMAL HIGH (ref 2.5–4.6)
Potassium: 3.8 mmol/L (ref 3.5–5.1)
SODIUM: 141 mmol/L (ref 135–145)

## 2017-04-25 LAB — CBC
HEMATOCRIT: 34.1 % — AB (ref 39.0–52.0)
Hemoglobin: 11.4 g/dL — ABNORMAL LOW (ref 13.0–17.0)
MCH: 30.5 pg (ref 26.0–34.0)
MCHC: 33.4 g/dL (ref 30.0–36.0)
MCV: 91.2 fL (ref 78.0–100.0)
PLATELETS: 392 10*3/uL (ref 150–400)
RBC: 3.74 MIL/uL — ABNORMAL LOW (ref 4.22–5.81)
RDW: 12.7 % (ref 11.5–15.5)
WBC: 9.6 10*3/uL (ref 4.0–10.5)

## 2017-04-25 LAB — TROPONIN I
TROPONIN I: 1.15 ng/mL — AB (ref <0.03)
TROPONIN I: 1.82 ng/mL — AB (ref <0.03)
TROPONIN I: 1.57 ng/mL — AB (ref <0.03)

## 2017-04-25 LAB — HIV ANTIBODY (ROUTINE TESTING W REFLEX): HIV Screen 4th Generation wRfx: NONREACTIVE

## 2017-04-25 LAB — HEMOGLOBIN A1C
HEMOGLOBIN A1C: 5.9 % — AB (ref 4.8–5.6)
MEAN PLASMA GLUCOSE: 122.63 mg/dL

## 2017-04-25 LAB — MAGNESIUM: MAGNESIUM: 1.9 mg/dL (ref 1.7–2.4)

## 2017-04-25 LAB — MRSA PCR SCREENING: MRSA by PCR: NEGATIVE

## 2017-04-25 MED ORDER — HEPARIN BOLUS VIA INFUSION
2000.0000 [IU] | Freq: Once | INTRAVENOUS | Status: AC
  Administered 2017-04-25: 2000 [IU] via INTRAVENOUS
  Filled 2017-04-25: qty 2000

## 2017-04-25 MED ORDER — ORAL CARE MOUTH RINSE
15.0000 mL | Freq: Two times a day (BID) | OROMUCOSAL | Status: DC
  Administered 2017-04-25 – 2017-04-26 (×4): 15 mL via OROMUCOSAL

## 2017-04-25 MED ORDER — DILTIAZEM HCL 60 MG PO TABS
60.0000 mg | ORAL_TABLET | Freq: Four times a day (QID) | ORAL | Status: DC
  Administered 2017-04-25 – 2017-04-26 (×5): 60 mg via ORAL
  Filled 2017-04-25 (×6): qty 1

## 2017-04-25 MED ORDER — DILTIAZEM 12 MG/ML ORAL SUSPENSION
60.0000 mg | Freq: Four times a day (QID) | ORAL | Status: DC
  Filled 2017-04-25 (×2): qty 6

## 2017-04-25 MED ORDER — POTASSIUM CHLORIDE CRYS ER 20 MEQ PO TBCR
40.0000 meq | EXTENDED_RELEASE_TABLET | ORAL | Status: AC
  Administered 2017-04-25 (×2): 40 meq via ORAL
  Filled 2017-04-25 (×2): qty 2

## 2017-04-25 MED ORDER — CHLORHEXIDINE GLUCONATE 0.12 % MT SOLN
15.0000 mL | Freq: Two times a day (BID) | OROMUCOSAL | Status: DC
  Administered 2017-04-25 – 2017-04-26 (×3): 15 mL via OROMUCOSAL

## 2017-04-25 MED ORDER — DILTIAZEM 12 MG/ML ORAL SUSPENSION
60.0000 mg | Freq: Four times a day (QID) | ORAL | Status: DC
  Filled 2017-04-25: qty 6

## 2017-04-25 MED ORDER — FUROSEMIDE 10 MG/ML IJ SOLN
20.0000 mg | Freq: Three times a day (TID) | INTRAMUSCULAR | Status: AC
  Administered 2017-04-25 – 2017-04-26 (×3): 20 mg via INTRAVENOUS
  Filled 2017-04-25 (×3): qty 2

## 2017-04-25 MED ORDER — APIXABAN 5 MG PO TABS
5.0000 mg | ORAL_TABLET | Freq: Two times a day (BID) | ORAL | Status: DC
  Administered 2017-04-25 – 2017-04-29 (×9): 5 mg via ORAL
  Filled 2017-04-25 (×10): qty 1

## 2017-04-25 MED ORDER — MAGNESIUM SULFATE 4 GM/100ML IV SOLN
4.0000 g | Freq: Once | INTRAVENOUS | Status: AC
  Administered 2017-04-25: 4 g via INTRAVENOUS
  Filled 2017-04-25: qty 100

## 2017-04-25 NOTE — Progress Notes (Signed)
Offgoing RN reported that there was question if the pt needed Echo due to cardiologist mentioning he was awaiting results for it. No order for Echo found, pt has not received Echo so this RN paged [facility] Cards NP on call in order to clarify but have not heard back.

## 2017-04-25 NOTE — Progress Notes (Signed)
RT Note: Received patient from ED on BIPAP 12/5, R-12 50% FIO2. RT will continue to monitor.

## 2017-04-25 NOTE — Progress Notes (Signed)
ANTICOAGULATION CONSULT NOTE   Pharmacy Consult for heparin Indication: atrial fibrillation  No Known Allergies  Patient Measurements: Height: 5\' 8"  (172.7 cm) Weight: 214 lb (97.1 kg) IBW/kg (Calculated) : 68.4 Heparin Dosing Weight: 89 kg  Vital Signs: Temp: 98.7 F (37.1 C) (11/27 2012) Temp Source: Oral (11/27 2012) BP: 134/97 (11/28 0300) Pulse Rate: 91 (11/28 0300)  Labs: Recent Labs    04/24/17 2011 04/25/17 0302  HGB 12.3* 11.4*  HCT 37.1* 34.1*  PLT 459* 392  HEPARINUNFRC  --  0.20*  CREATININE 1.26*  --     Estimated Creatinine Clearance: 70.5 mL/min (A) (by C-G formula based on SCr of 1.26 mg/dL (H)).  Assessment: 61 y.o. male with Afib for heparin  Goal of Therapy:  Heparin level 0.3-0.7 units/ml Monitor platelets by anticoagulation protocol: Yes   Plan:  Heparin 2000 units IV bolus, then increase heparin  1600 units/hr Check heparin level in 6 hours.   Geannie Risen, PharmD, BCPS  04/25/2017 3:50 AM

## 2017-04-25 NOTE — Progress Notes (Signed)
RT removed patient from BIPAP and placed on 4lnc. Vital signs stable at this time. No complications. RN aware and in room. RT will continue to monitor.

## 2017-04-25 NOTE — Progress Notes (Signed)
Progress Note  Patient Name: Roger Humphrey Date of Encounter: 04/25/2017  Primary Cardiologist: New  Subjective   Feels much better. Insists he was completely asymptomatic before the day of admission, working as a Surveyor, minerals - no dyspnea, chest pain or palpitations. Remains in AFlutter, V rate 80-100 on IV diltiazem.  Inpatient Medications    Scheduled Meds: . apixaban  5 mg Oral BID  . aspirin  324 mg Oral Once  . chlorhexidine  15 mL Mouth Rinse BID  . diltiazem  60 mg Oral Q6H  . furosemide  20 mg Intravenous Q8H  . mouth rinse  15 mL Mouth Rinse q12n4p  . potassium chloride  40 mEq Oral Q4H   Continuous Infusions:  PRN Meds: levalbuterol   Vital Signs    Vitals:   04/25/17 0900 04/25/17 1000 04/25/17 1100 04/25/17 1215  BP: 127/89 110/86 121/78   Pulse: 76 77 76   Resp: 20 15 (!) 22   Temp:    98.1 F (36.7 C)  TempSrc:    Oral  SpO2: 94% 94% 93%   Weight:      Height:        Intake/Output Summary (Last 24 hours) at 04/25/2017 1320 Last data filed at 04/25/2017 1300 Gross per 24 hour  Intake 801.2 ml  Output 2630 ml  Net -1828.8 ml   Filed Weights   04/24/17 2008 04/25/17 0421  Weight: 214 lb (97.1 kg) 220 lb 7.4 oz (100 kg)    Telemetry    AFlutter - Personally Reviewed  ECG    AFlutter, RVR, LVH - Personally Reviewed  Physical Exam  Obese, but also muscular GEN: No acute distress.   Neck: No JVD Cardiac: irregular, no murmurs, rubs, or gallops.  Respiratory: Clear to auscultation bilaterally. GI: Soft, nontender, non-distended  MS: No edema; No deformity. Neuro:  Nonfocal  Psych: Normal affect   Labs    Chemistry Recent Labs  Lab 04/24/17 2011 04/25/17 0302  NA 138 141  K 3.7 3.8  CL 109 110  CO2 22 22  GLUCOSE 131* 114*  BUN 17 14  CREATININE 1.26* 1.18  CALCIUM 8.8* 8.6*  PROT 6.6  --   ALBUMIN 3.3* 3.2*  AST 34  --   ALT 28  --   ALKPHOS 73  --   BILITOT 0.6  --   GFRNONAA >60 >60  GFRAA >60 >60    ANIONGAP 7 9     Hematology Recent Labs  Lab 04/24/17 2011 04/25/17 0302  WBC 9.5 9.6  RBC 4.01* 3.74*  HGB 12.3* 11.4*  HCT 37.1* 34.1*  MCV 92.5 91.2  MCH 30.7 30.5  MCHC 33.2 33.4  RDW 12.9 12.7  PLT 459* 392    Cardiac Enzymes Recent Labs  Lab 04/25/17 0302 04/25/17 0928  TROPONINI 1.15* 1.82*    Recent Labs  Lab 04/24/17 2024  TROPIPOC 1.18*     BNP Recent Labs  Lab 04/24/17 2011  BNP 396.4*     DDimer No results for input(s): DDIMER in the last 168 hours.   Radiology    Dg Chest Port 1 View  Result Date: 04/25/2017 CLINICAL DATA:  61 year old male presenting with shortness of Breath, presenting an Hypertensive emergency with pulmonary edema. Atrial flutter. EXAM: PORTABLE CHEST 1 VIEW COMPARISON:  04/24/2017. FINDINGS: Portable AP semi upright view at 0414 hours. Bilateral veiling pulmonary opacity in obscuration of the diaphragm. Dense retrocardiac opacity appears increased. Stable cardiomegaly and mediastinal contours. Minimally improved apical lung ventilation  and decreased vascularity. Visualized tracheal air column is within normal limits. IMPRESSION: Continued widespread pulmonary edema with suspected bilateral pleural effusions, and bilateral lower lobe collapse/ consolidation. Electronically Signed   By: Odessa FlemingH  Hall M.D.   On: 04/25/2017 06:48   Dg Chest Port 1 View  Result Date: 04/24/2017 CLINICAL DATA:  Severe shortness of breath. EXAM: PORTABLE CHEST 1 VIEW COMPARISON:  Chest x-ray dated June 11, 2010. FINDINGS: Mild cardiomegaly. Normal pulmonary vascularity. Diffuse consolidative opacities in the right greater than left lungs. Small right pleural effusion. No pneumothorax. No acute osseous abnormality. IMPRESSION: 1. Diffuse consolidative opacities in the right greater than left lungs, which could reflect ARDS, edema, or hemorrhage. Electronically Signed   By: Obie DredgeWilliam T Derry M.D.   On: 04/24/2017 20:43    Cardiac Studies   Echo  pending  Patient Profile     61 y.o. male presenting with chest pain, severe dyspnea, severe HTN and AFlutter w RVR after cocaine use on Thanksgiving  Assessment & Plan    1. CHF:  Readily explained by severe HTN and arrhythmia, but cardiomegaly on CXR. Echo pending. Much better after diuretics and rate control. 2. AFlutter:  Now rate controlled, switching to PO diltiazem. He will benefit from return to NSR. Scheduled for DCCV tomorrow noon, preceded by TEE due to uncertain day of arrhythmia onset. Cocaine use Thursday, ED presentation 5 days later. Switch IV heparin to PO apixaban. If there is no structural heart disease, may stop anticoagulation in 30 days. If there is low LVEF or marked atrial enlargement, consider long-term anticoagulation. 3. HTN: runs in family, but he has never been diagnosed with it. Has LVH on ECG. BP much better now, may have been caused by, or just worsened by cocaine. Will keep on Ca channel blockers. If EF is low, beta blockers preferable, but have to wait for cocaine washout and get a better idea about his future compliance with abstention and follow up. 4. NSTEMI: could be explained by tachycardia and severe HTN. If LVEF is very low, will need CAD evaluation. 5. Cocaine: "I won't do it again".  For questions or updates, please contact [facility] Please consult [email or web address] for contact info under Cardiology/STEMI.      Signed, Thurmon FairMihai Calen Posch, MD  04/25/2017, 1:20 PM

## 2017-04-25 NOTE — Progress Notes (Signed)
PULMONARY / CRITICAL CARE MEDICINE   Name: Roger Humphrey MRN: [id number] DOB: 30-Jun-1955    ADMISSION DATE:  04/24/2017 CONSULTATION DATE:  04/24/2017  REFERRING MD:  Dr. Donnald Garre  CHIEF COMPLAINT:  SOB/HTN crisis  HISTORY OF PRESENT ILLNESS:   Roger Humphrey is a 61 year old male with no known past medical history other than tobacco abuse.  He has not seen a physician in "years".  Presented to the ER on 11/27 with worsening shortness of breath for ~ 24 hrs  He reported that he socially drinks beer and usually does not do illicit drugs but smoked marijuana on Thanksgiving night.  He said that his friends did not tell him till afterwards that it was laced with cocaine.  Since, he has felt palpitations noticed that he was could not catch his breath laying down last night with occasional dry cough.  Today it continued, along with intermittent "cramps" in his left upper chest/axilla area, sweats, and acutely worsened after going to [employer].  Denies fever, chills, productive cough or hemoptysis, lower extremity swelling or pain, or syncope.  He was found to be in Aflutter with acute respiratory distress per EMS and treated with cardizem, albuterol/atrovent, and noted to be 85% on room air.  In the ER, BP 176/110, aflutter with rate 120's, ongoing severe dyspnea with rales requiring NIMV.  CXR consistent with acute pulmonary edema.  EKG showing aflutter.  Labs noted for BNP 396, troponin 1.18, sCr 1.26, and UDS + cocaine.  Cardiology consulted by EDP and started on NTG, heparin, and Cardizem gtt.  PCCM asked to admit.   SUBJECTIVE:  -Transition to nasal cannula this morning He denies chest pain, feeling better  VITAL SIGNS: BP (!) 136/96   Pulse 77   Temp 97.8 F (36.6 C) (Axillary)   Resp 20   Ht 5\' 8"  (1.727 m)   Wt 220 lb 7.4 oz (100 kg)   SpO2 99%   BMI 33.52 kg/m  4 L nasal cannula . diltiazem (CARDIZEM) infusion 5 mg/hr (04/25/17 0600)  . heparin 1,600 Units/hr (04/25/17 0830)     HEMODYNAMICS:    VENTILATOR SETTINGS: FiO2 (%):  [50 %] 50 %  INTAKE / OUTPUT: I/O last 3 completed shifts: In: 142.7 [I.V.:142.7] Out: 1500 [Urine:1500]  PHYSICAL EXAMINATION: General:-year-old African-American male resting comfortably in bed now on nasal cannula no acute distress HEENT normocephalic atraumatic no jugular venous distention mucous membranes are moist Cardiac: Irregularly irregular, no murmur rub or gallop.  In and out of atrial fibrillation. Pulmonary: Basilar rales, no accessory muscle use Abdomen: Soft nontender no organomegaly no pain extremities/musculoskeletal: Warm dry positive pulses no edema brisk cap refill Neuro/psych: Awake alert oriented no focal deficits GU: Voiding large quantity of clear yellow urine  LABS:  BMET Recent Labs  Lab 04/24/17 2011 04/25/17 0302  NA 138 141  K 3.7 3.8  CL 109 110  CO2 22 22  BUN 17 14  CREATININE 1.26* 1.18  GLUCOSE 131* 114*    Electrolytes Recent Labs  Lab 04/24/17 2011 04/25/17 0302  CALCIUM 8.8* 8.6*  MG 2.0 1.9  PHOS  --  4.9*    CBC Recent Labs  Lab 04/24/17 2011 04/25/17 0302  WBC 9.5 9.6  HGB 12.3* 11.4*  HCT 37.1* 34.1*  PLT 459* 392    Coag's No results for input(s): APTT, INR in the last 168 hours.  Sepsis Markers No results for input(s): LATICACIDVEN, PROCALCITON, O2SATVEN in the last 168 hours.  ABG No results for input(s):  PHART, PCO2ART, PO2ART in the last 168 hours.  Liver Enzymes Recent Labs  Lab 04/24/17 2011 04/25/17 0302  AST 34  --   ALT 28  --   ALKPHOS 73  --   BILITOT 0.6  --   ALBUMIN 3.3* 3.2*    Cardiac Enzymes Recent Labs  Lab 04/25/17 0302  TROPONINI 1.15*    Glucose Recent Labs  Lab 04/25/17 0025 04/25/17 0414 04/25/17 0731  GLUCAP 131* 106* 112*    Imaging Dg Chest Port 1 View  Result Date: 04/25/2017 CLINICAL DATA:  61 year old male presenting with shortness of Breath, presenting an Hypertensive emergency with pulmonary  edema. Atrial flutter. EXAM: PORTABLE CHEST 1 VIEW COMPARISON:  04/24/2017. FINDINGS: Portable AP semi upright view at 0414 hours. Bilateral veiling pulmonary opacity in obscuration of the diaphragm. Dense retrocardiac opacity appears increased. Stable cardiomegaly and mediastinal contours. Minimally improved apical lung ventilation and decreased vascularity. Visualized tracheal air column is within normal limits. IMPRESSION: Continued widespread pulmonary edema with suspected bilateral pleural effusions, and bilateral lower lobe collapse/ consolidation. Electronically Signed   By: Odessa FlemingH  Hall M.D.   On: 04/25/2017 06:48   Dg Chest Port 1 View  Result Date: 04/24/2017 CLINICAL DATA:  Severe shortness of breath. EXAM: PORTABLE CHEST 1 VIEW COMPARISON:  Chest x-ray dated June 11, 2010. FINDINGS: Mild cardiomegaly. Normal pulmonary vascularity. Diffuse consolidative opacities in the right greater than left lungs. Small right pleural effusion. No pneumothorax. No acute osseous abnormality. IMPRESSION: 1. Diffuse consolidative opacities in the right greater than left lungs, which could reflect ARDS, edema, or hemorrhage. Electronically Signed   By: Obie DredgeWilliam T Derry M.D.   On: 04/24/2017 20:43   STUDIES:  CXR 04/24/2017 >> Diffuse consolidative opacities in the right greater than left lungs, which could reflect ARDS, edema, or hemorrhage  EKG 11/27 >> Aflutter, rate 130, prolonged QTc .481, probable LVH  CULTURES: MRSA PCR 11/27 >>  ANTIBIOTICS: none  SIGNIFICANT EVENTS: 11/27 Admit  LINES/TUBES: PIV x 2  DISCUSSION: 89 yoM with no known PMH except tobacco abuse but doesnt see a doctor presenting with severe SOB- progressed since last night, palpitations, diaphoresis, and intermittent CP.  Smoked marijuana on Thanksgiving and did not know it was laced with cocaine.  Found hypertensive, Aflutter 130's, and hypoxic requiring NIMV, NGT and Cardizem gtt, and lasix.  Cards consulted.  He is now off  noninvasive ventilation, although still has significant pulmonary edema and pleural effusions on chest x-ray.  For today plan will be to continue to push aggressive diuresis, transition to oral diltiazem as he can continues to be in and out of atrial fibrillation, continue anticoagulation with heparin drip, discontinue nitroglycerin infusion as he has no chest discomfort.  We are awaiting echocardiogram as results will determine further cardiac ischemic evaluation.  If stable off diltiazem later today can go to stepdown  ASSESSMENT / PLAN:   Acute hypoxic respiratory failure secondary to acute pulmonary edema Tobacco abuse  -Now a -1.3 L -Chest x-ray personally reviewed this morning: Demonstrates persistent diffuse pulmonary edema and what appears to be right greater than left pleural effusions not significant improvement in comparison to yesterday's film P:   Repeat Lasix again aiming for negative volume status Continue supplemental oxygen Wean oxygen Mobilize Tobacco cessation Repeat chest x-ray in a.m.  Hypertensive Emergency with acute pulmonary edema and new onset Aflutter with positive troponin + Cocaine use Non-ST elevation MI type II exacerbated by cocaine toxicity and demand ischemia P:  Tele monitoring Awaiting echocardiogram, cardiology to  decide on possible cardiac catheterization Avoiding beta blockade given recent cocaine use Continuing heparin drip In and out of atrial fibrillation, will transition to oral diltiazem Will discontinue nitroglycerin infusion  AKI- previous sCr 1.10 (05/2010) Improved, following Lasix P:   Repeat Lasix again today We will empirically replace magnesium and potassium Repeat a.m. chemistry  Anemia- mild P:  Trend CBC while on heparin infusion   FAMILY  - Updates:  No family at bedside.  Patient updated concerning plan of care.   - Inter-disciplinary family meet or Palliative Care meeting due by:  05/01/2017  Simonne Martinet  ACNP-BC [facility] Pulmonary/Critical Care Pager # [phone] OR # [phone] if no answer   04/25/2017, 9:20 AM

## 2017-04-25 NOTE — Discharge Instructions (Signed)

## 2017-04-25 NOTE — Progress Notes (Signed)
ANTICOAGULATION CONSULT NOTE   Pharmacy Consult for heparin Indication: atrial fibrillation  No Known Allergies  Patient Measurements: Height: 5\' 8"  (172.7 cm) Weight: 220 lb 7.4 oz (100 kg) IBW/kg (Calculated) : 68.4 Heparin Dosing Weight: 89 kg  Vital Signs: Temp: 97.8 F (36.6 C) (11/28 0734) Temp Source: Axillary (11/28 0734) BP: 121/78 (11/28 1100) Pulse Rate: 76 (11/28 1100)  Labs: Recent Labs    04/24/17 2011 04/25/17 0302 04/25/17 0928  HGB 12.3* 11.4*  --   HCT 37.1* 34.1*  --   PLT 459* 392  --   HEPARINUNFRC  --  0.20* 0.29*  CREATININE 1.26* 1.18  --   TROPONINI  --  1.15*  --     Estimated Creatinine Clearance: 76.3 mL/min (by C-G formula based on SCr of 1.18 mg/dL).  Assessment: 61 y.o. male with Afib for heparin. No signs/sx of bleeding, CBC stable. Renal function stable.  Heparin level subtherapeutic, 0.29.  Goal of Therapy:  Heparin level 0.3-0.7 units/ml Monitor platelets by anticoagulation protocol: Yes   Plan:  Increase heparin to 1700 units/hr. Check heparin level in 6 hours.  Daily heparin level, CBC Monitor clinical course, s/sx bleeding  Donnella Bi, PharmD PGY1 Acute Care Pharmacy Resident Pager: [phone]  04/25/2017 11:39 AM

## 2017-04-26 ENCOUNTER — Inpatient Hospital Stay (HOSPITAL_COMMUNITY): Payer: Self-pay

## 2017-04-26 DIAGNOSIS — I4892 Unspecified atrial flutter: Secondary | ICD-10-CM

## 2017-04-26 DIAGNOSIS — I340 Nonrheumatic mitral (valve) insufficiency: Secondary | ICD-10-CM

## 2017-04-26 DIAGNOSIS — N179 Acute kidney failure, unspecified: Secondary | ICD-10-CM

## 2017-04-26 LAB — COMPREHENSIVE METABOLIC PANEL
ALK PHOS: 63 U/L (ref 38–126)
ALT: 23 U/L (ref 17–63)
AST: 24 U/L (ref 15–41)
Albumin: 3.2 g/dL — ABNORMAL LOW (ref 3.5–5.0)
Anion gap: 7 (ref 5–15)
BILIRUBIN TOTAL: 0.5 mg/dL (ref 0.3–1.2)
BUN: 15 mg/dL (ref 6–20)
CALCIUM: 8.8 mg/dL — AB (ref 8.9–10.3)
CHLORIDE: 106 mmol/L (ref 101–111)
CO2: 25 mmol/L (ref 22–32)
CREATININE: 1.3 mg/dL — AB (ref 0.61–1.24)
GFR calc Af Amer: >60 mL/min (ref >60)
GFR, EST NON AFRICAN AMERICAN: 58 mL/min — AB (ref >60)
Glucose, Bld: 106 mg/dL — ABNORMAL HIGH (ref 65–99)
Potassium: 4.2 mmol/L (ref 3.5–5.1)
Sodium: 138 mmol/L (ref 135–145)
TOTAL PROTEIN: 6.3 g/dL — AB (ref 6.5–8.1)

## 2017-04-26 LAB — GLUCOSE, CAPILLARY
GLUCOSE-CAPILLARY: 93 mg/dL (ref 65–99)
GLUCOSE-CAPILLARY: 87 mg/dL (ref 65–99)
Glucose-Capillary: 98 mg/dL (ref 65–99)
Glucose-Capillary: 81 mg/dL (ref 65–99)
Glucose-Capillary: 108 mg/dL — ABNORMAL HIGH (ref 65–99)
Glucose-Capillary: 113 mg/dL — ABNORMAL HIGH (ref 65–99)

## 2017-04-26 LAB — CBC
HEMATOCRIT: 36.4 % — AB (ref 39.0–52.0)
HEMOGLOBIN: 12.1 g/dL — AB (ref 13.0–17.0)
MCH: 30.5 pg (ref 26.0–34.0)
MCHC: 33.2 g/dL (ref 30.0–36.0)
MCV: 91.7 fL (ref 78.0–100.0)
Platelets: 443 10*3/uL — ABNORMAL HIGH (ref 150–400)
RBC: 3.97 MIL/uL — AB (ref 4.22–5.81)
RDW: 12.7 % (ref 11.5–15.5)
WBC: 8.4 10*3/uL (ref 4.0–10.5)

## 2017-04-26 LAB — ECHOCARDIOGRAM COMPLETE
Height: 68 in
Weight: 3414.48 oz

## 2017-04-26 MED ORDER — SODIUM CHLORIDE 0.9 % IV SOLN
INTRAVENOUS | Status: DC
  Administered 2017-04-26: 10 mL/h via INTRAVENOUS

## 2017-04-26 MED ORDER — MIDAZOLAM HCL 2 MG/2ML IJ SOLN
INTRAMUSCULAR | Status: AC
  Administered 2017-04-26: 3 mg
  Filled 2017-04-26: qty 4

## 2017-04-26 MED ORDER — ETOMIDATE 2 MG/ML IV SOLN
INTRAVENOUS | Status: AC
  Administered 2017-04-26: 10 mg
  Filled 2017-04-26: qty 10

## 2017-04-26 MED ORDER — SODIUM CHLORIDE 0.9 % IV SOLN: INTRAVENOUS | Status: DC

## 2017-04-26 MED ORDER — SODIUM CHLORIDE 0.9% FLUSH: 10.0000 mL | INTRAVENOUS | Status: DC | PRN

## 2017-04-26 MED ORDER — POTASSIUM CHLORIDE 10 MEQ/100ML IV SOLN: 10.0000 meq | INTRAVENOUS | Status: DC

## 2017-04-26 MED ORDER — POTASSIUM CHLORIDE 10 MEQ/100ML IV SOLN
10.0000 meq | INTRAVENOUS | Status: AC
  Administered 2017-04-26: 10 meq via INTRAVENOUS

## 2017-04-26 MED ORDER — CHLORHEXIDINE GLUCONATE CLOTH 2 % EX PADS: 6.0000 | MEDICATED_PAD | Freq: Every day | CUTANEOUS | Status: DC

## 2017-04-26 MED ORDER — FENTANYL CITRATE (PF) 100 MCG/2ML IJ SOLN
INTRAMUSCULAR | Status: AC
  Administered 2017-04-26: 50 ug
  Filled 2017-04-26: qty 4

## 2017-04-26 MED ORDER — POTASSIUM CHLORIDE 10 MEQ/100ML IV SOLN
10.0000 meq | INTRAVENOUS | Status: DC
  Administered 2017-04-26: 10 meq via INTRAVENOUS
  Filled 2017-04-26 (×2): qty 100

## 2017-04-26 MED ORDER — FUROSEMIDE 10 MG/ML IJ SOLN
20.0000 mg | Freq: Two times a day (BID) | INTRAMUSCULAR | Status: AC
  Administered 2017-04-26 – 2017-04-27 (×2): 20 mg via INTRAVENOUS
  Filled 2017-04-26 (×2): qty 2

## 2017-04-26 MED ORDER — MAGNESIUM SULFATE 2 GM/50ML IV SOLN
2.0000 g | Freq: Once | INTRAVENOUS | Status: AC
  Administered 2017-04-26: 2 g via INTRAVENOUS
  Filled 2017-04-26: qty 50

## 2017-04-26 NOTE — Procedures (Signed)
Procedure: Conscious sedation for cardioversion Indication: Comfort for cardioversion in setting of atrial fibrillation  Procedure: Patient was in the intensive care already on supplemental oxygen and telemetry monitoring.  He had already completed a transesophageal echocardiogram and had received both fentanyl and Versed.  He was arousable, and hemodynamically stable with a pulse oximetry in the high 90s on nasal cannula support.  Appropriate IV access verified nursing staff was at bedside, supplemental oxygen, airway cart, and bag valve mask available as well as suction. The patient was administered 10 mg of etomidate IV.  This induced adequate sedation with regular respiratory rate and no notable decrease in saturation.  Following this he was administered 1 synchronized cardioversion at 100 J by cardiology successful conversion to normal sinus rhythm.  He remained hemodynamically stable with oxygen saturations never dropping below 97% during procedure.  Following the procedure he was placed in the left lateral recovery position on supplemental oxygen.  He was monitored closely by nursing staff including telemetry monitoring, as well as continuous pulse oximetry.  He remained in normal sinus rhythm, following the procedure he was awake oriented and with no distress.  He tolerated the procedure well.  My time 30 minutes  Simonne Martinet ACNP-BC [facility] Pulmonary/Critical Care Pager # [phone] OR # [phone] if no answer

## 2017-04-26 NOTE — Progress Notes (Signed)
Procedure: Electrical Cardioversion Indications:  Atrial Flutter  Procedure Details:  Consent: Risks of procedure as well as the alternatives and risks of each were explained to the (patient/caregiver).  Consent for procedure obtained.  Time Out: Verified patient identification, verified procedure, site/side was marked, verified correct patient position, special equipment/implants available, medications/allergies/relevent history reviewed, required imaging and test results available.  Performed  Patient placed on cardiac monitor, pulse oximetry, supplemental oxygen as necessary.  Sedation given: Etomidate, administered by PCCM, Dr. Isaiah Serge, Anders Simmonds, NP. Pacer pads placed anterior and posterior chest.  Cardioverted 1 time(s).  Cardioversion with synchronized biphasic 120J shock.  Evaluation: Findings: Post procedure EKG shows: NSR Complications: None Patient did tolerate procedure well.  Time Spent Directly with the Patient:  30 minutes   Zackeriah Kissler 04/26/2017, 12:45 PM

## 2017-04-26 NOTE — Progress Notes (Signed)
  Echocardiogram Echocardiogram Transesophageal has been performed.  Delcie Roch 04/26/2017, 1:50 PM

## 2017-04-26 NOTE — Progress Notes (Signed)
*  PRELIMINARY RESULTS* Echocardiogram 2D Echocardiogram has been performed.  Roger Humphrey 04/26/2017, 11:36 AM

## 2017-04-26 NOTE — Care Management Note (Signed)
Case Management Note  Patient Details  Name: Roger Humphrey MRN: [id number] Date of Birth: [birth year]  Subjective/Objective:  Pt admitted with SOB and HTN crisis                  Action/Plan:   PTA independent from home.  Pt does not have PCP - CM will attempt to make appt at [facility] clinic prior to discharge (CM will provide clinic info on AVS).  CM provided Eliquis free 30 day card to pt and instructed pt to contact pt assistance.     Expected Discharge Date:                  Expected Discharge Plan:  Home/Self Care  In-House Referral:     Discharge planning Services  CM Consult  Post Acute Care Choice:    Choice offered to:     DME Arranged:    DME Agency:     HH Arranged:    HH Agency:     Status of Service:  In process, will continue to follow  If discussed at Long Length of Stay Meetings, dates discussed:    Additional Comments:  Cherylann Parr, RN 04/26/2017, 4:24 PM

## 2017-04-26 NOTE — Progress Notes (Signed)
Progress Note  Patient Name: Roger BilberryMichael Humphrey Date of Encounter: 04/26/2017  Primary Cardiologist: new  Subjective   No CV complaints overnight. Remains in atrial flutter. Both BP and HR normal during sleep (70-80, 120/70 range), but HR and BP slightly elevated once he wakes up.  Inpatient Medications    Scheduled Meds: . apixaban  5 mg Oral BID  . aspirin  324 mg Oral Once  . chlorhexidine  15 mL Mouth Rinse BID  . diltiazem  60 mg Oral Q6H  . mouth rinse  15 mL Mouth Rinse q12n4p   Continuous Infusions:  PRN Meds: levalbuterol   Vital Signs    Vitals:   04/26/17 0441 04/26/17 0500 04/26/17 0530 04/26/17 0740  BP:  122/81    Pulse:  81 78   Resp:  (!) 21 (!) 21   Temp: 98.8 F (37.1 C)   98.1 F (36.7 C)  TempSrc: Oral   Oral  SpO2:  97% 93%   Weight:  213 lb 6.5 oz (96.8 kg)    Height:        Intake/Output Summary (Last 24 hours) at 04/26/2017 0750 Last data filed at 04/26/2017 0000 Gross per 24 hour  Intake 1387.94 ml  Output 3310 ml  Net -1922.06 ml   Filed Weights   04/24/17 2008 04/25/17 0421 04/26/17 0500  Weight: 214 lb (97.1 kg) 220 lb 7.4 oz (100 kg) 213 lb 6.5 oz (96.8 kg)    Telemetry    A Flutter, controlled rate - Personally Reviewed  ECG    No new tracing - Personally Reviewed  Physical Exam  Alert, comfortable GEN: No acute distress.   Neck: No JVD Cardiac: RRR, no murmurs, rubs, or gallops.  Respiratory: Clear to auscultation bilaterally. GI: Soft, nontender, non-distended  MS: No edema; No deformity. Neuro:  Nonfocal  Psych: Normal affect   Labs    Chemistry Recent Labs  Lab 04/24/17 2011 04/25/17 0302 04/26/17 0235  NA 138 141 138  K 3.7 3.8 4.2  CL 109 110 106  CO2 22 22 25   GLUCOSE 131* 114* 106*  BUN 17 14 15   CREATININE 1.26* 1.18 1.30*  CALCIUM 8.8* 8.6* 8.8*  PROT 6.6  --  6.3*  ALBUMIN 3.3* 3.2* 3.2*  AST 34  --  24  ALT 28  --  23  ALKPHOS 73  --  63  BILITOT 0.6  --  0.5  GFRNONAA >60 >60  58*  GFRAA >60 >60 >60  ANIONGAP 7 9 7      Hematology Recent Labs  Lab 04/24/17 2011 04/25/17 0302 04/26/17 0235  WBC 9.5 9.6 8.4  RBC 4.01* 3.74* 3.97*  HGB 12.3* 11.4* 12.1*  HCT 37.1* 34.1* 36.4*  MCV 92.5 91.2 91.7  MCH 30.7 30.5 30.5  MCHC 33.2 33.4 33.2  RDW 12.9 12.7 12.7  PLT 459* 392 443*    Cardiac Enzymes Recent Labs  Lab 04/25/17 0302 04/25/17 0928 04/25/17 1354  TROPONINI 1.15* 1.82* 1.57*    Recent Labs  Lab 04/24/17 2024  TROPIPOC 1.18*     BNP Recent Labs  Lab 04/24/17 2011  BNP 396.4*     DDimer No results for input(s): DDIMER in the last 168 hours.   Radiology    Dg Chest Port 1 View  Result Date: 04/26/2017 CLINICAL DATA:  Pulmonary edema EXAM: PORTABLE CHEST 1 VIEW COMPARISON:  04/25/2017 FINDINGS: Diffuse bilateral airspace disease with basilar predominance is mildly improved. Bilateral pleural effusions and bibasilar atelectasis unchanged. Cardiac enlargement.  IMPRESSION: Congestive heart failure with diffuse edema. Mild interval improvement in edema. Electronically Signed   By: Marlan Palau M.D.   On: 04/26/2017 06:58   Dg Chest Port 1 View  Result Date: 04/25/2017 CLINICAL DATA:  61 year old male presenting with shortness of Breath, presenting an Hypertensive emergency with pulmonary edema. Atrial flutter. EXAM: PORTABLE CHEST 1 VIEW COMPARISON:  04/24/2017. FINDINGS: Portable AP semi upright view at 0414 hours. Bilateral veiling pulmonary opacity in obscuration of the diaphragm. Dense retrocardiac opacity appears increased. Stable cardiomegaly and mediastinal contours. Minimally improved apical lung ventilation and decreased vascularity. Visualized tracheal air column is within normal limits. IMPRESSION: Continued widespread pulmonary edema with suspected bilateral pleural effusions, and bilateral lower lobe collapse/ consolidation. Electronically Signed   By: Odessa Fleming M.D.   On: 04/25/2017 06:48   Dg Chest Port 1 View  Result Date:  04/24/2017 CLINICAL DATA:  Severe shortness of breath. EXAM: PORTABLE CHEST 1 VIEW COMPARISON:  Chest x-ray dated June 11, 2010. FINDINGS: Mild cardiomegaly. Normal pulmonary vascularity. Diffuse consolidative opacities in the right greater than left lungs. Small right pleural effusion. No pneumothorax. No acute osseous abnormality. IMPRESSION: 1. Diffuse consolidative opacities in the right greater than left lungs, which could reflect ARDS, edema, or hemorrhage. Electronically Signed   By: Obie Dredge M.D.   On: 04/24/2017 20:43    Cardiac Studies   Echo pending  Patient Profile     61 y.o. male presenting with chest pain, severe dyspnea, severe HTN and AFlutter w RVR after cocaine use on Thanksgiving  Assessment & Plan    1. CHF:  Still waiting for echo, may get TEE first. Appears to be euvolemic. Decompensation is readily explained by severe HTN and arrhythmia, but cardiomegaly on CXR suggests chronicity.  2. AFlutter:  Reasonable rate control after switching to PO diltiazem. Will have received third dose of apixaban at 10 AM today. He will benefit from return to NSR. Having difficulty securing Anesthesiology coverage in endoscopy today. May have to ask PCCM to assist with sedation at bedside. This procedure has been fully reviewed with the patient and written informed consent has been obtained. If there is no structural heart disease, may stop anticoagulation in 30 days. If there is low LVEF or marked atrial enlargement, consider long-term anticoagulation. 3. HTN: Suspect this is chronic (LVH on ecg). Will plan to switch to sustained release diltiazem. If EF is low, beta blockers preferable, but have to wait for cocaine washout and get a better idea about his future compliance with abstention and follow up. 4. NSTEMI: could be explained by tachycardia and severe HTN. If LVEF is very low, will need CAD evaluation. 5. Cocaine: reports he plans to be abstinent.    For questions or  updates, please contact [facility] Please consult [email or web address] for contact info under Cardiology/STEMI.      Signed, Thurmon Fair, MD  04/26/2017, 7:50 AM

## 2017-04-26 NOTE — Progress Notes (Signed)
INDICATIONS: atrial flutter pre-cardioversion  PROCEDURE:   Informed consent was obtained prior to the procedure. The risks, benefits and alternatives for the procedure were discussed and the patient comprehended these risks.  Risks include, but are not limited to, cough, sore throat, vomiting, nausea, somnolence, esophageal and stomach trauma or perforation, bleeding, low blood pressure, aspiration, pneumonia, infection, trauma to the teeth and death.    After a procedural time-out, the oropharynx was anesthetized with 20% benzocaine spray.   During this procedure the patient was administered a total of Versed 3 mg and Fentanyl 50 mg to achieve and maintain moderate conscious sedation.  The patient's heart rate, blood pressure, and oxygen saturationweare monitored continuously during the procedure. The period of conscious sedation was 18 minutes, of which I was present face-to-face 100% of this time.  The transesophageal probe was inserted in the esophagus and stomach without difficulty and multiple views were obtained.  The patient was kept under observation until the patient left the procedure room.  The patient left the procedure room in stable condition.   Agitated microbubble saline contrast was not administered.  COMPLICATIONS:    There were no immediate complications.  FINDINGS:  LV is mildly dilated and severely depressed, EF 25% with diffuse hypokinesis, but probably disproportionate anterior wall hypokinesis, suggesting ischemic cardiomyopathy. LA is mildly dilated. NO LA thrombus, good appendage emptying velocities. Interatrial septum bows left to right consistent with elevated LA pressure. 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 Flow velocity across the ASD is higher than expected, again suggesting elevated left atrial pressure. Moderately dilated RA. Mildly dilated RV with preserved function. No effusion. No aortic atherosclerosis. Mild MR, mild TR. Normal  AV.  RECOMMENDATIONS:    Proceed with cardioversion. Coronary angiography.  Time Spent Directly with the Patient:  30 minutes   Mykell Rawl 04/26/2017, 12:40 PM

## 2017-04-26 NOTE — Progress Notes (Signed)
Patient sitting up in bed, no distress on nasal cannula. Vitals stable. RT will continue to monitor.

## 2017-04-26 NOTE — Progress Notes (Signed)
Per d/w Dr. [name], he would like to stop pt's diltiazem due to low EF and order cardiac CTA for AM. Orders placed. Message sent to Dr. Shirlee Latch to make him aware. Dr. [name] plans to address pt's HR in AM to make sure it is low enough for CT (BB not yet started due to recent cocaine use). Dayna Dunn PA-C

## 2017-04-26 NOTE — Progress Notes (Signed)
PULMONARY / CRITICAL CARE MEDICINE   Name: Roger Humphrey MRN: [id number] DOB: Mar 27, 1956    ADMISSION DATE:  04/24/2017 CONSULTATION DATE:  04/24/2017  REFERRING MD:  Dr. Donnald Garre  CHIEF COMPLAINT:  SOB/HTN crisis  HISTORY OF PRESENT ILLNESS:   Roger Humphrey is a 61 year old male with no known past medical history other than tobacco abuse.  He has not seen a physician in "years".  Presented to the ER on 11/27 with worsening shortness of breath for ~ 24 hrs  He reported that he socially drinks beer and usually does not do illicit drugs but smoked marijuana on Thanksgiving night.  He said that his friends did not tell him till afterwards that it was laced with cocaine.  Since, he has felt palpitations noticed that he was could not catch his breath laying down last night with occasional dry cough.  Today it continued, along with intermittent "cramps" in his left upper chest/axilla area, sweats, and acutely worsened after going to [employer].  Denies fever, chills, productive cough or hemoptysis, lower extremity swelling or pain, or syncope.  He was found to be in Aflutter with acute respiratory distress per EMS and treated with cardizem, albuterol/atrovent, and noted to be 85% on room air.  In the ER, BP 176/110, aflutter with rate 120's, ongoing severe dyspnea with rales requiring NIMV.  CXR consistent with acute pulmonary edema.  EKG showing aflutter.  Labs noted for BNP 396, troponin 1.18, sCr 1.26, and UDS + cocaine.  Cardiology consulted by EDP and started on NTG, heparin, and Cardizem gtt.  PCCM asked to admit.   SUBJECTIVE:  Feeling stronger.  VITAL SIGNS: BP 123/79   Pulse 72   Temp 98.1 F (36.7 C) (Oral)   Resp 18   Ht 5\' 8"  (1.727 m)   Wt 213 lb 6.5 oz (96.8 kg)   SpO2 96%   BMI 32.45 kg/m   4 L nasal cannula   HEMODYNAMICS:    VENTILATOR SETTINGS:    INTAKE / OUTPUT: I/O last 3 completed shifts: In: 1530.7 [P.O.:1020; I.V.:410.7; IV Piggyback:100] Out: 4810  [Urine:4810]  PHYSICAL EXAMINATION: General: 61 year old African-American male resting comfortably in bed he is in no distress he appears much more comfortable today when compared to yesterday.  Next HEENT: Normocephalic atraumatic no JVD mucous membranes are moist. Pulmonary: Basilar rales, no accessory muscle use, resting comfortably. Cardiac: Regular irregular atrial fibrillation still on monitor.  Abdomen: Soft nontender no organomegaly. Extremities/musculoskeletal: , no edema, brisk cap refill, strong pulses. Neuro/psych: Awake oriented no focal deficits  LABS:  BMET Recent Labs  Lab 04/24/17 2011 04/25/17 0302 04/26/17 0235  NA 138 141 138  K 3.7 3.8 4.2  CL 109 110 106  CO2 22 22 25   BUN 17 14 15   CREATININE 1.26* 1.18 1.30*  GLUCOSE 131* 114* 106*    Electrolytes Recent Labs  Lab 04/24/17 2011 04/25/17 0302 04/26/17 0235  CALCIUM 8.8* 8.6* 8.8*  MG 2.0 1.9  --   PHOS  --  4.9*  --     CBC Recent Labs  Lab 04/24/17 2011 04/25/17 0302 04/26/17 0235  WBC 9.5 9.6 8.4  HGB 12.3* 11.4* 12.1*  HCT 37.1* 34.1* 36.4*  PLT 459* 392 443*    Coag's No results for input(s): APTT, INR in the last 168 hours.  Sepsis Markers No results for input(s): LATICACIDVEN, PROCALCITON, O2SATVEN in the last 168 hours.  ABG No results for input(s): PHART, PCO2ART, PO2ART in the last 168 hours.  Liver Enzymes  Recent Labs  Lab 04/24/17 2011 04/25/17 0302 04/26/17 0235  AST 34  --  24  ALT 28  --  23  ALKPHOS 73  --  63  BILITOT 0.6  --  0.5  ALBUMIN 3.3* 3.2* 3.2*    Cardiac Enzymes Recent Labs  Lab 04/25/17 0302 04/25/17 0928 04/25/17 1354  TROPONINI 1.15* 1.82* 1.57*    Glucose Recent Labs  Lab 04/25/17 1217 04/25/17 1531 04/25/17 1952 04/25/17 2356 04/26/17 0440 04/26/17 0722  GLUCAP 97 103* 97 96 93 98    Imaging Dg Chest Port 1 View  Result Date: 04/26/2017 CLINICAL DATA:  Pulmonary edema EXAM: PORTABLE CHEST 1 VIEW COMPARISON:   04/25/2017 FINDINGS: Diffuse bilateral airspace disease with basilar predominance is mildly improved. Bilateral pleural effusions and bibasilar atelectasis unchanged. Cardiac enlargement. IMPRESSION: Congestive heart failure with diffuse edema. Mild interval improvement in edema. Electronically Signed   By: Marlan Palauharles  Clark M.D.   On: 04/26/2017 06:58   STUDIES:  CXR 04/24/2017 >> Diffuse consolidative opacities in the right greater than left lungs, which could reflect ARDS, edema, or hemorrhage  EKG 11/27 >> Aflutter, rate 130, prolonged QTc .481, probable LVH  CULTURES: MRSA PCR 11/27 >>  ANTIBIOTICS: none  SIGNIFICANT EVENTS: 11/27 Admit  LINES/TUBES: PIV x 2  DISCUSSION: [age] yoM with no known PMH except tobacco abuse but doesnt see a doctor presenting with severe SOB- progressed since last night, palpitations, diaphoresis, and intermittent CP.  Smoked marijuana on Thanksgiving and did not know it was laced with cocaine.  Found hypertensive, Aflutter 130's, and hypoxic requiring NIMV, NGT and noninvasive positive pressure ventilation.  He remains in atrial fibrillation, chest x-ray has improved but still volume overloaded no significant change in approach in regards to his plan of care except for today we will be cardioverting him.  Cardiology is is asked critical care services to assist with this as there is no anesthesia service available today.  ASSESSMENT / PLAN:   Acute hypoxic respiratory failure secondary to acute pulmonary edema Tobacco abuse   he is now 3.8 L negative Chest x-ray personally reviewed this demonstrates right greater than left pulmonary edema/element of pleural effusion.  The aeration has improved some since yesterday's exam P:   Wean oxygen wean supplemental oxygen Continue diuretics as BUN/creatinine and blood pressure allow (will decrease to 1 dose today) Tobacco cessation Cardioversion today, we have been asked to assist with conscious sedation for  this.  Hypertensive Emergency with acute pulmonary edema and new onset Aflutter with positive troponin + Cocaine use Non-ST elevation MI type II exacerbated by cocaine toxicity and demand ischemia P:  Continue telemetry monitoring Avoiding beta-blockade For cardioversion later this morning Further evaluation per cardiology. Follow-up echocardiogram  AKI- previous sCr 1.10 (05/2010) Improved, following Lasix P:   Continuing diuretics and aggressive electrolyte replacement  Anemia- mild P:  Continue anticoagulation per cardiology   FAMILY  - Updates:  No family at bedside.  Patient updated concerning plan of care.   - Inter-disciplinary family meet or Palliative Care meeting due by:  05/01/2017  Simonne MartinetPeter E Zaion Hreha ACNP-BC [facility] Pager # [phone] OR # [phone] if no answer   04/26/2017, 9:47 AM

## 2017-04-27 ENCOUNTER — Inpatient Hospital Stay (HOSPITAL_COMMUNITY): Payer: Self-pay

## 2017-04-27 ENCOUNTER — Encounter (HOSPITAL_COMMUNITY): Payer: Self-pay | Admitting: Radiology

## 2017-04-27 ENCOUNTER — Encounter (HOSPITAL_COMMUNITY)
Admission: EM | Disposition: A | Discharge status: Home / Self Care | Payer: Self-pay | Source: Home / Self Care | Attending: Internal Medicine

## 2017-04-27 DIAGNOSIS — I161 Hypertensive emergency: Secondary | ICD-10-CM

## 2017-04-27 DIAGNOSIS — F1910 Other psychoactive substance abuse, uncomplicated: Secondary | ICD-10-CM

## 2017-04-27 DIAGNOSIS — I5041 Acute combined systolic (congestive) and diastolic (congestive) heart failure: Secondary | ICD-10-CM

## 2017-04-27 DIAGNOSIS — R079 Chest pain, unspecified: Secondary | ICD-10-CM

## 2017-04-27 LAB — BASIC METABOLIC PANEL
Anion gap: 7 (ref 5–15)
BUN: 20 mg/dL (ref 6–20)
CALCIUM: 8.7 mg/dL — AB (ref 8.9–10.3)
CO2: 24 mmol/L (ref 22–32)
CREATININE: 1.36 mg/dL — AB (ref 0.61–1.24)
Chloride: 103 mmol/L (ref 101–111)
GFR calc Af Amer: >60 mL/min (ref >60)
GFR calc non Af Amer: 55 mL/min — ABNORMAL LOW (ref >60)
GLUCOSE: 101 mg/dL — AB (ref 65–99)
Potassium: 4.1 mmol/L (ref 3.5–5.1)
Sodium: 134 mmol/L — ABNORMAL LOW (ref 135–145)

## 2017-04-27 LAB — CBC
HEMATOCRIT: 37.9 % — AB (ref 39.0–52.0)
Hemoglobin: 12.3 g/dL — ABNORMAL LOW (ref 13.0–17.0)
MCH: 29.9 pg (ref 26.0–34.0)
MCHC: 32.5 g/dL (ref 30.0–36.0)
MCV: 92 fL (ref 78.0–100.0)
Platelets: 467 10*3/uL — ABNORMAL HIGH (ref 150–400)
RBC: 4.12 MIL/uL — ABNORMAL LOW (ref 4.22–5.81)
RDW: 12.8 % (ref 11.5–15.5)
WBC: 8.4 10*3/uL (ref 4.0–10.5)

## 2017-04-27 LAB — GLUCOSE, CAPILLARY
GLUCOSE-CAPILLARY: 112 mg/dL — AB (ref 65–99)
GLUCOSE-CAPILLARY: 129 mg/dL — AB (ref 65–99)
Glucose-Capillary: 93 mg/dL (ref 65–99)
Glucose-Capillary: 103 mg/dL — ABNORMAL HIGH (ref 65–99)

## 2017-04-27 LAB — MAGNESIUM: Magnesium: 2.3 mg/dL (ref 1.7–2.4)

## 2017-04-27 IMAGING — CT CT HEART MORP W/ CTA COR W/ SCORE W/ CA W/CM &/OR W/O CM
4 of 7 series · 8 of 20 positions shown, 9 images · non-contrast
Comparison: Chest radiograph of 04/27/2017.  No prior CT.

ADDENDUM:
CT FFR:

1. Occluded ramus.
2. FFR < 0.5 in the distal LCx, suggesting hemodynamically
significant stenosis.
3. FFR 0.75 in the mid LAD, suggesting hemodynamically significant
stenosis.
EXAM:
OVER-READ INTERPRETATION  CT CHEST
The following report is an over-read performed by radiologist Dr.
does not include interpretation of cardiac or coronary anatomy or
pathology. The CTA interpretation by the cardiologist is attached.
CLINICAL DATA: Chest pain
Cardiac CTA
MEDICATIONS:
Sub lingual nitro .4mg and lopressor 40mg IV
TECHNIQUE: The patient was scanned on a Siemens [REDACTED]ice scanner. Gantry
rotation speed was 240 msecs. Collimation was 0.6 mm. A 100 kV
prospective scan was triggered in the ascending thoracic aorta
centered around 35-75% of the R-R interval. Average HR during the
scan was 75 bpm. The 3D data set was interpreted on a dedicated work
station using MPR, MIP and VRT modes. A total of 80cc of contrast
was used.

[Series 6: best diast 69 % · axial · 0.47mm/px · z∈[+1160,+1208]mm · 2 of 361 slices shown, 3 images]
[im 121/361  vessel]
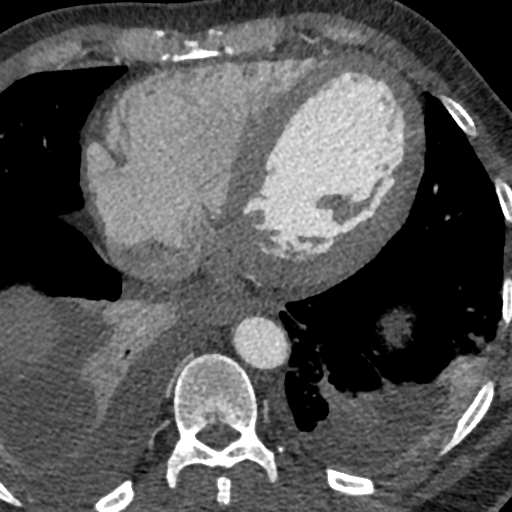
[im 121/361  lung]
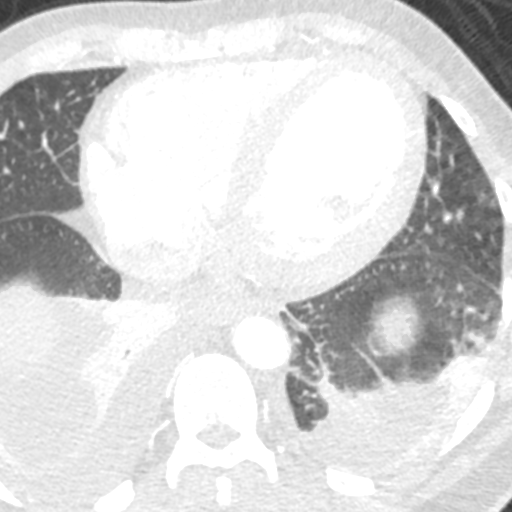
[im 241/361  vessel]
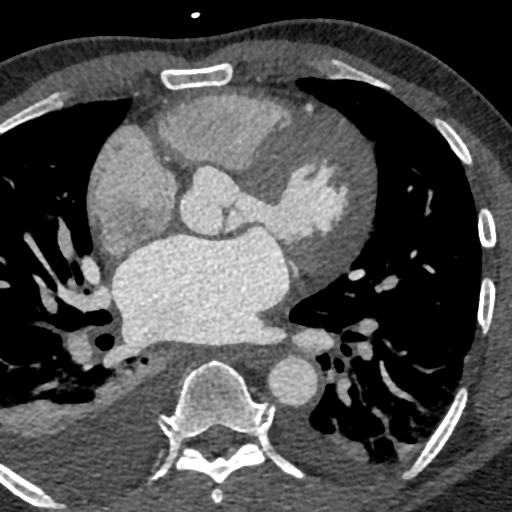

[Series 7: best syst 38 % · axial · 0.47mm/px · z∈[+1160,+1208]mm · 2 of 361 slices shown]
[im 121/361  vessel]
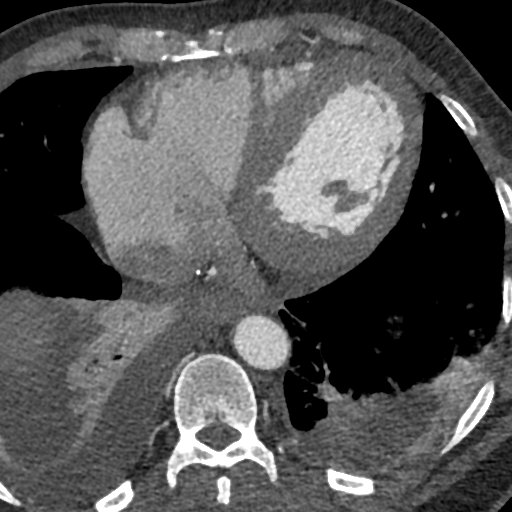
[im 241/361  vessel]
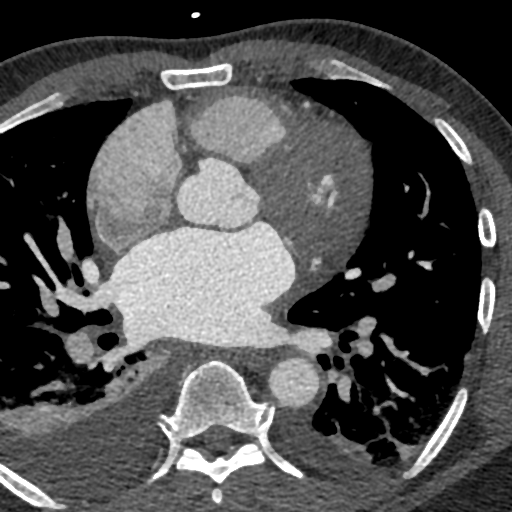

[Series 8: ts diast sharp 69 % · axial · 0.47mm/px · z∈[+1160,+1208]mm · 2 of 361 slices shown]
[im 121/361  lung]
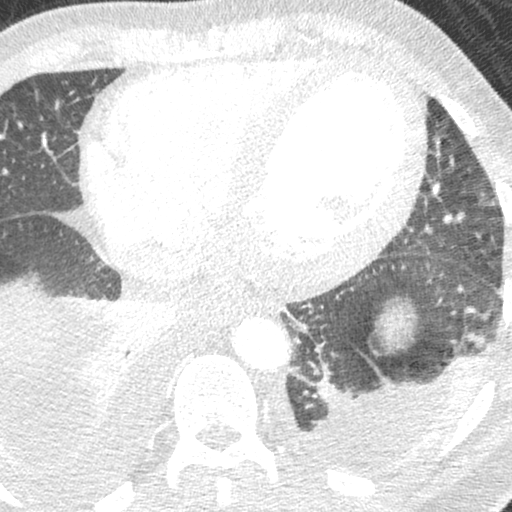
[im 241/361  lung]
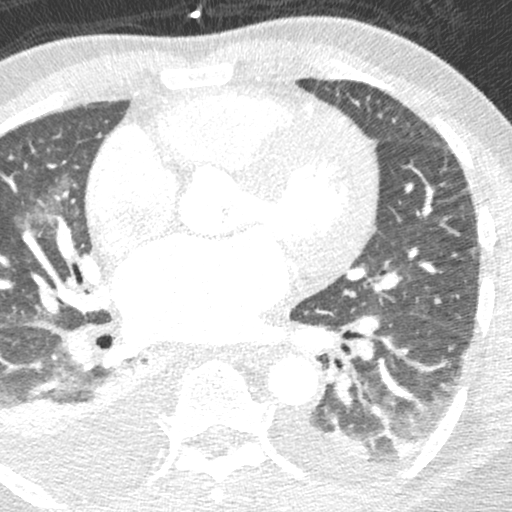

[Series 9: ts syst sharp 38 % · axial · 0.47mm/px · z∈[+1160,+1208]mm · 2 of 361 slices shown]
[im 121/361  lung]
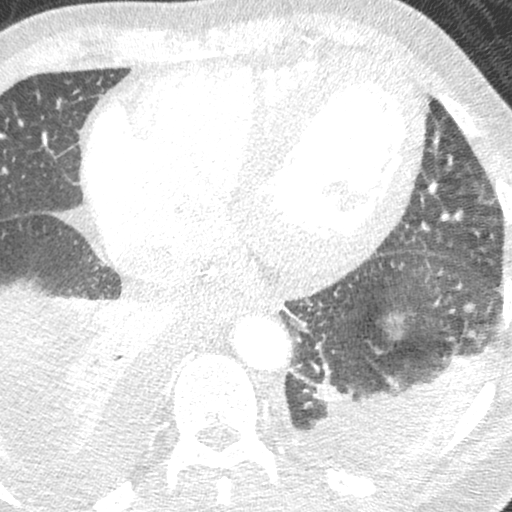
[im 241/361  lung]
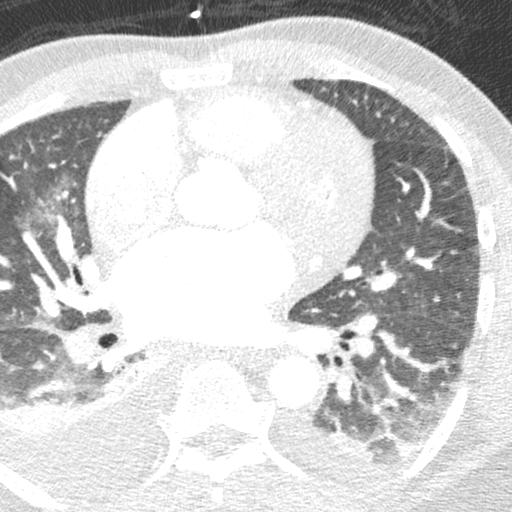

[8 of 20 positions shown; findings below may reference images not displayed]

FINDINGS: vascular: Normal aortic caliber. No dissection. No central pulmonary
embolism, on this non-dedicated study.

Mediastinum/Nodes: Borderline to mild subcarinal adenopathy at
cm on image 6/series 11. Right infrahilar node of 9 mm on image
12/series 11.

Lungs/Pleura: Small right larger than left pleural effusions.
Dependent bibasilar airspace disease. Upper lobe predominant
perihilar ground-glass opacity and mild septal thickening.

Upper Abdomen: Normal imaged portions of the liver, spleen, stomach,
adrenal glands, kidneys.

Musculoskeletal: No acute osseous abnormality.
IMPRESSION: 1. Congestive heart failure, with bilateral pleural effusions and
ground-glass opacity. Bibasilar airspace disease is favored to
represent atelectasis.
2. Borderline to mild thoracic adenopathy, most likely related to
congestive heart failure. If any history of primary malignancy or
clinical concern of lymphoproliferative process, consider follow-up
chest CT at 6 months.
These results will be called to the ordering clinician or
representative by the Radiologist Assistant, and communication
documented in the PACS or zVision Dashboard.
FINDINGS: Non-cardiac: See separate report from [REDACTED].

Small secundum atrial septal defect was noted.

Calcium Score:  990 Agatston units.

Coronary Arteries:  Left dominant with no anomalies

LM:  No plaque or stenosis.

LAD system: Large vessel, wraps around the apex to cover PDA
territory. Mixed plaque in the ostial-proximal LAD with possible
50-60% stenosis (not severe range). There was an area of calcified
plaque in the mid LAD with mild stenosis. Farther along, there was
an area of soft plaque in the mid LAD with up to 50% stenosis. Mild
to possibly moderate distal LAD stenosis.

Circumflex: There was a large ramus that was totally occluded
proximally. The AV LCx was a small caliber vessel. Moderate stenosis
with mixed plaque proximally. Severe stenosis in the mid-vessel.

RCA: Small, nondominant RCA. There was mixed plaque with possible
mild stenosis in the mid vessel.
IMPRESSION: 1.  Small secundum ASD noted.

2. Coronary artery calcium score 990 Agatston units, placing the
patient in the 96th percentile for age and gender. This suggests
high risk for future cardiac events.

3.  Large ramus occluded proximally.

4. Small AV LCx with moderate stenosis proximally and severe
stenosis more distally. This vessel is unlikely to be amenable to
PCI.

5.  Nondominant RCA.

6. Diffuse disease in the LAD, no areas look critical. 50-60% range
stenosis in the proximal LAD with up to 50% stenosis in the mid LAD
and mild to moderate distal LAD stenosis.

I will send this study for FFR, specifically to evaluate the LAD.

Chapo Shelton

## 2017-04-27 SURGERY — ECHOCARDIOGRAM, TRANSESOPHAGEAL
Anesthesia: Monitor Anesthesia Care

## 2017-04-27 MED ORDER — METOPROLOL TARTRATE 25 MG PO TABS
25.0000 mg | ORAL_TABLET | Freq: Once | ORAL | Status: DC
  Filled 2017-04-27: qty 1

## 2017-04-27 MED ORDER — DILTIAZEM HCL 25 MG/5ML IV SOLN
10.0000 mg | Freq: Once | INTRAVENOUS | Status: AC
  Administered 2017-04-27: 10 mg via INTRAVENOUS
  Filled 2017-04-27: qty 5

## 2017-04-27 MED ORDER — NITROGLYCERIN 0.4 MG SL SUBL
SUBLINGUAL_TABLET | SUBLINGUAL | Status: AC
Start: 2017-04-27 — End: 2017-04-28
  Filled 2017-04-27: qty 1

## 2017-04-27 MED ORDER — IOPAMIDOL (ISOVUE-370) INJECTION 76%
INTRAVENOUS | Status: AC
  Administered 2017-04-27: 80 mL
  Filled 2017-04-27: qty 100

## 2017-04-27 MED ORDER — METOPROLOL TARTRATE 5 MG/5ML IV SOLN
5.0000 mg | INTRAVENOUS | Status: DC | PRN
  Administered 2017-04-27 (×8): 5 mg via INTRAVENOUS
  Filled 2017-04-27 (×7): qty 5

## 2017-04-27 MED ORDER — METOPROLOL TARTRATE 25 MG PO TABS
25.0000 mg | ORAL_TABLET | Freq: Once | ORAL | Status: AC
  Administered 2017-04-27: 25 mg via ORAL
  Filled 2017-04-27: qty 1

## 2017-04-27 MED ORDER — METOPROLOL TARTRATE 5 MG/5ML IV SOLN
INTRAVENOUS | Status: AC
  Filled 2017-04-27: qty 5

## 2017-04-27 MED ORDER — NITROGLYCERIN 0.4 MG SL SUBL
0.4000 mg | SUBLINGUAL_TABLET | Freq: Once | SUBLINGUAL | Status: AC
  Administered 2017-04-27: 0.4 mg via SUBLINGUAL

## 2017-04-27 NOTE — Progress Notes (Signed)
Patient currently refusing chair. Will attempt when patient gets to in bed.

## 2017-04-27 NOTE — Progress Notes (Signed)
PROGRESS NOTE    Zahar Vowles  NUU:[id number] DOB: 04/29/1956 DOA: 04/24/2017 PCP: Patient, No Pcp Per   Brief Narrative:  61 year old BM  PMHxTobacco Abuse, Substance Abuse, EtOH abuse?Marland Kitchen Has not seen a physician in "years".     Presented to the ER on 11/27 with worsening shortness of breath for ~ 24 hrs   He reported that he socially drinks beer and usually does not do illicit drugs but smoked marijuana on Thanksgiving night.  He said that his friends did not tell him till afterwards that it was laced with cocaine.  Since, he has felt palpitations noticed that he was could not catch his breath laying down last night with occasional dry cough.  Today it continued, along with intermittent "cramps" in his left upper chest/axilla area, sweats, and acutely worsened after going to [employer].  Denies fever, chills, productive cough or hemoptysis, lower extremity swelling or pain, or syncope.   He was found to be in Aflutter with acute respiratory distress per EMS and treated with cardizem, albuterol/atrovent, and noted to be 85% on room air.  In the ER, BP 176/110, aflutter with rate 120's, ongoing severe dyspnea with rales requiring NIMV.  CXR consistent with acute pulmonary edema.  EKG showing aflutter.  Labs noted for BNP 396, troponin 1.18, sCr 1.26, and UDS + cocaine.  Cardiology consulted by EDP and started on NTG, heparin, and Cardizem gtt.  PCCM asked to admit.     Subjective: 11/30 A/O 4, negative CP, negative SOB, negative abdominal pain, negative N/V. States plans on never using cocaine again.   Assessment & Plan:   Active Problems:   Hypertensive urgency   Acute respiratory failure with hypoxia (HCC)   Acute diastolic (congestive) heart failure (HCC)   Cocaine abuse (HCC)   AKI (acute kidney injury) (HCC)   Demand ischemia of myocardium (HCC)   Substance abuse -11/27 UDS positive for cocaine    HTN Emergency -Avoid unopposed beta blockade secondary to patient's cocaine  use  -Resolved   Atrial flutter/NSTEMI/Acute Systolic CHF   -Secondary to cocaine use  -Echocardiogram: Show systolic CHF with hypokinesis see results below -Strict in and out since admission -5.1 L -Daily weight Filed Weights   04/25/17 0421 04/26/17 0500 04/27/17 0500  Weight: 220 lb 7.4 oz (100 kg) 213 lb 6.5 oz (96.8 kg) 216 lb 0.8 oz (98 kg)  -Transfuse for hemoglobin<8  Elevated troponin -Secondary to patient's NSTEMI. Currently asymptomatic  Acute Respiratory Failure with Hypoxia -Resolved  -Titrate O2 to maintain SPO2> 93%   Pulmonary edema   Acute renal failure (baseline Cr 1.1 in 05/2010) Recent Labs  Lab 04/24/17 2011 04/25/17 0302 04/26/17 0235 04/27/17 0351  CREATININE 1.26* 1.18 1.30* 1.36*                   DVT prophylaxis: Eliquis Code Status: Full Family Communication: None Disposition Plan: TBD   Consultants:  Cardiology [facility]     Procedures/Significant Events:  11/29 TEE ;Left ventricle: mild to   moderate LVH. -LVEF =25% to 30%. Severe diffuse hypokinesis with regional variations.   Disproportionately severe hypokinesis of the anteroseptal and anterior myocardium; consistent with ischemia in the distribution LAD. - Left atrium: moderately dilated. Negative thrombus in the atrial cavity or appendage.  - Right atrium: moderately dilated. - Atrial septum: The septum bowed from left to right, consistent  with increased left atrial pressure. There was a small secundum atrial septal defect. Doppler showed a moderate left-to-right  atrial level shunt. - Pericardium, extracardiac:left pleural effusion. 11/29 Echocardiogram:Left ventricle:  LVEF = 30 to 35% with hypokinesis / akinesis of the anterior and lateral walls. -Mild LVH  -Left atrium: severely dilated. -Right atrium: The atrium was mildly dilated.     I have personally reviewed and interpreted all radiology studies and my findings are as above.  VENTILATOR  SETTINGS:    Cultures   Antimicrobials:    Devices    LINES / TUBES:      Continuous Infusions: . sodium chloride    . sodium chloride Stopped (04/26/17 1632)     Objective: Vitals:   04/27/17 0338 04/27/17 0400 04/27/17 0500 04/27/17 0600  BP:   127/84 122/86  Pulse:  90 86 88  Resp:  (!) 26 (!) 23 17  Temp: 98.6 F (37 C)     TempSrc: Oral     SpO2:  94% 95% 95%  Weight:   216 lb 0.8 oz (98 kg)   Height:        Intake/Output Summary (Last 24 hours) at 04/27/2017 0735 Last data filed at 04/27/2017 0600 Gross per 24 hour  Intake 795.33 ml  Output 1950 ml  Net -1154.67 ml   Filed Weights   04/25/17 0421 04/26/17 0500 04/27/17 0500  Weight: 220 lb 7.4 oz (100 kg) 213 lb 6.5 oz (96.8 kg) 216 lb 0.8 oz (98 kg)    Examination:  General: A/O 4, No acute respiratory distress Neck:  Negative scars, masses, torticollis, lymphadenopathy, JVD Lungs: Clear to auscultation bilaterally without wheezes or crackles Cardiovascular: Regular rate and rhythm without murmur gallop or rub normal S1 and S2 Abdomen: negative abdominal pain, nondistended, positive soft, bowel sounds, no rebound, no ascites, no appreciable mass Extremities: No significant cyanosis, clubbing, or edema bilateral lower extremities Skin: Negative rashes, lesions, ulcers Psychiatric:  Negative depression, negative anxiety, negative fatigue, negative mania  Central nervous system:  Cranial nerves II through XII intact, tongue/uvula midline, all extremities muscle strength 5/5, sensation intact throughout,  negative dysarthria, negative expressive aphasia, negative receptive aphasia.  .     Data Reviewed: Care during the described time interval was provided by me .  I have reviewed this patient's available data, including medical history, events of note, physical examination, and all test results as part of my evaluation.   CBC: Recent Labs  Lab 04/24/17 2011 04/25/17 0302 04/26/17 0235  04/27/17 0351  WBC 9.5 9.6 8.4 8.4  HGB 12.3* 11.4* 12.1* 12.3*  HCT 37.1* 34.1* 36.4* 37.9*  MCV 92.5 91.2 91.7 92.0  PLT 459* 392 443* 467*   Basic Metabolic Panel: Recent Labs  Lab 04/24/17 2011 04/25/17 0302 04/26/17 0235 04/27/17 0351  NA 138 141 138 134*  K 3.7 3.8 4.2 4.1  CL 109 110 106 103  CO2 22 22 25 24   GLUCOSE 131* 114* 106* 101*  BUN 17 14 15 20   CREATININE 1.26* 1.18 1.30* 1.36*  CALCIUM 8.8* 8.6* 8.8* 8.7*  MG 2.0 1.9  --  2.3  PHOS  --  4.9*  --   --    GFR: Estimated Creatinine Clearance: 65.5 mL/min (A) (by C-G formula based on SCr of 1.36 mg/dL (H)). Liver Function Tests: Recent Labs  Lab 04/24/17 2011 04/25/17 0302 04/26/17 0235  AST 34  --  24  ALT 28  --  23  ALKPHOS 73  --  63  BILITOT 0.6  --  0.5  PROT 6.6  --  6.3*  ALBUMIN 3.3* 3.2* 3.2*  No results for input(s): LIPASE, AMYLASE in the last 168 hours. No results for input(s): AMMONIA in the last 168 hours. Coagulation Profile: No results for input(s): INR, PROTIME in the last 168 hours. Cardiac Enzymes: Recent Labs  Lab 04/25/17 0302 04/25/17 0928 04/25/17 1354  TROPONINI 1.15* 1.82* 1.57*   BNP (last 3 results) No results for input(s): PROBNP in the last 8760 hours. HbA1C: Recent Labs    04/25/17 0302  HGBA1C 5.9*   CBG: Recent Labs  Lab 04/26/17 1528 04/26/17 1932 04/26/17 2314 04/27/17 0335 04/27/17 0732  GLUCAP 81 108* 113* 93 103*   Lipid Profile: No results for input(s): CHOL, HDL, LDLCALC, TRIG, CHOLHDL, LDLDIRECT in the last 72 hours. Thyroid Function Tests: Recent Labs    04/24/17 2011  TSH 0.564   Anemia Panel: No results for input(s): VITAMINB12, FOLATE, FERRITIN, TIBC, IRON, RETICCTPCT in the last 72 hours. Urine analysis:    Component Value Date/Time   COLORURINE STRAW (A) 04/24/2017 2050   APPEARANCEUR CLEAR 04/24/2017 2050   LABSPEC 1.009 04/24/2017 2050   PHURINE 5.0 04/24/2017 2050   GLUCOSEU NEGATIVE 04/24/2017 2050   HGBUR  NEGATIVE 04/24/2017 2050   BILIRUBINUR NEGATIVE 04/24/2017 2050   KETONESUR NEGATIVE 04/24/2017 2050   PROTEINUR NEGATIVE 04/24/2017 2050   NITRITE NEGATIVE 04/24/2017 2050   LEUKOCYTESUR NEGATIVE 04/24/2017 2050   Sepsis Labs: @LABRCNTIP (procalcitonin:4,lacticidven:4)  ) Recent Results (from the past 240 hour(s))  MRSA PCR Screening     Status: None   Collection Time: 04/25/17 12:27 AM  Result Value Ref Range Status   MRSA by PCR NEGATIVE NEGATIVE Final    Comment:        The GeneXpert MRSA Assay (FDA approved for NASAL specimens only), is one component of a comprehensive MRSA colonization surveillance program. It is not intended to diagnose MRSA infection nor to guide or monitor treatment for MRSA infections.          Radiology Studies: Dg Chest Port 1 View  Result Date: 04/26/2017 CLINICAL DATA:  Pulmonary edema EXAM: PORTABLE CHEST 1 VIEW COMPARISON:  04/25/2017 FINDINGS: Diffuse bilateral airspace disease with basilar predominance is mildly improved. Bilateral pleural effusions and bibasilar atelectasis unchanged. Cardiac enlargement. IMPRESSION: Congestive heart failure with diffuse edema. Mild interval improvement in edema. Electronically Signed   By: Marlan Palau M.D.   On: 04/26/2017 06:58        Scheduled Meds: . apixaban  5 mg Oral BID  . aspirin  324 mg Oral Once  . furosemide  20 mg Intravenous BID   Continuous Infusions: . sodium chloride    . sodium chloride Stopped (04/26/17 1632)     LOS: 3 days    Time spent: 40 minutes    WOODS, Roselind Messier, MD [facility] Pager [phone]   If 7PM-7AM, please contact night-coverage [email or web address] Password [email or web address] 04/27/2017, 7:35 AM

## 2017-04-27 NOTE — Progress Notes (Signed)
Received patient from , pt alert and oriented, nocomplaints. Will endorsed accordingly.

## 2017-04-27 NOTE — Progress Notes (Signed)
Pt in CT for CTA with heart rate mid-high 70's. Dr. Shirlee Latch requesting patient to receive more IV cardizem to lower heart rate. Pt refusing stating he has had "too much" already. Pt educated on requirements of this exam and that if his heart rate isn't low enough the exam may be non-diagnostic. Pt aware and still refuses. Pt did agree to receive SL nitro. Will continue to monitor. Asher Muir Jonah Nestle,RN

## 2017-04-27 NOTE — Progress Notes (Signed)
Pt HR is still in the 70's. CTA was scheduled at 1300 and has been to pushed back. Dr. [name] was paged, gave a verbal order for a third dose of 25 mg lopressor oral, not given because Dr. Shirlee Latch was notified, and gave a  verbal order for 5 mg of Lopressor IV every 5 mins. 30 mg of lopressor IV has been given at this time. Attempting to notify MD.

## 2017-04-27 NOTE — Progress Notes (Signed)
Progress Note  Patient Name: Roger Humphrey Date of Encounter: 04/27/2017  Primary Cardiologist: New  Subjective   No CV complaints overnight. Remains in NSR. HR in mid 80s. Had a R arm midline IV placed. Denies dyspnea.  Inpatient Medications    Scheduled Meds: . apixaban  5 mg Oral BID  . aspirin  324 mg Oral Once  . metoprolol tartrate  25 mg Oral Once   Continuous Infusions: . sodium chloride    . sodium chloride Stopped (04/26/17 1632)   PRN Meds: levalbuterol, sodium chloride flush   Vital Signs    Vitals:   04/27/17 0500 04/27/17 0600 04/27/17 0700 04/27/17 0731  BP: 127/84 122/86 128/89   Pulse: 86 88 88   Resp: (!) 23 17 18    Temp:    98.4 F (36.9 C)  TempSrc:    Oral  SpO2: 95% 95% 95%   Weight: 216 lb 0.8 oz (98 kg)     Height:        Intake/Output Summary (Last 24 hours) at 04/27/2017 0803 Last data filed at 04/27/2017 0600 Gross per 24 hour  Intake 795.33 ml  Output 1450 ml  Net -654.67 ml   Filed Weights   04/25/17 0421 04/26/17 0500 04/27/17 0500  Weight: 220 lb 7.4 oz (100 kg) 213 lb 6.5 oz (96.8 kg) 216 lb 0.8 oz (98 kg)    Telemetry    NSR - Personally Reviewed  ECG    NSR, LAA, LVH, prominent repol changes - Personally Reviewed  Physical Exam  Looks comfortable GEN: No acute distress.   Neck: No JVD Cardiac: RRR, no murmurs, rubs, or gallops.  Respiratory: Clear to auscultation bilaterally. GI: Soft, nontender, non-distended  MS: No edema; No deformity. Neuro:  Nonfocal  Psych: Normal affect   Labs    Chemistry Recent Labs  Lab 04/24/17 2011 04/25/17 0302 04/26/17 0235 04/27/17 0351  NA 138 141 138 134*  K 3.7 3.8 4.2 4.1  CL 109 110 106 103  CO2 22 22 25 24   GLUCOSE 131* 114* 106* 101*  BUN 17 14 15 20   CREATININE 1.26* 1.18 1.30* 1.36*  CALCIUM 8.8* 8.6* 8.8* 8.7*  PROT 6.6  --  6.3*  --   ALBUMIN 3.3* 3.2* 3.2*  --   AST 34  --  24  --   ALT 28  --  23  --   ALKPHOS 73  --  63  --   BILITOT 0.6   --  0.5  --   GFRNONAA >60 >60 58* 55*  GFRAA >60 >60 >60 >60  ANIONGAP 7 9 7 7      Hematology Recent Labs  Lab 04/25/17 0302 04/26/17 0235 04/27/17 0351  WBC 9.6 8.4 8.4  RBC 3.74* 3.97* 4.12*  HGB 11.4* 12.1* 12.3*  HCT 34.1* 36.4* 37.9*  MCV 91.2 91.7 92.0  MCH 30.5 30.5 29.9  MCHC 33.4 33.2 32.5  RDW 12.7 12.7 12.8  PLT 392 443* 467*    Cardiac Enzymes Recent Labs  Lab 04/25/17 0302 04/25/17 0928 04/25/17 1354  TROPONINI 1.15* 1.82* 1.57*    Recent Labs  Lab 04/24/17 2024  TROPIPOC 1.18*     BNP Recent Labs  Lab 04/24/17 2011  BNP 396.4*     DDimer No results for input(s): DDIMER in the last 168 hours.   Radiology    Dg Chest Port 1 View  Result Date: 04/26/2017 CLINICAL DATA:  Pulmonary edema EXAM: PORTABLE CHEST 1 VIEW COMPARISON:  04/25/2017 FINDINGS:  Diffuse bilateral airspace disease with basilar predominance is mildly improved. Bilateral pleural effusions and bibasilar atelectasis unchanged. Cardiac enlargement. IMPRESSION: Congestive heart failure with diffuse edema. Mild interval improvement in edema. Electronically Signed   By: Marlan Palauharles  Clark M.D.   On: 04/26/2017 06:58    Cardiac Studies   TTEcho 04/26/2017 - Left ventricle:  LVEF is depressed at approximately 30 to 35% with hypokinesis / akinesis of the anterior and lateral walls. The cavity size was normal. Wall thickness was increased in a pattern of mild LVH. - Mitral valve: There was mild regurgitation. - Left atrium: The atrium was severely dilated. - Right atrium: The atrium was mildly dilated. - Pericardium, extracardiac: A trivial pericardial effusion was   identified.  TEE 04/26/2017 LV is mildly dilated and severely depressed, EF 25% with diffuse hypokinesis, but probably disproportionate anterior wall hypokinesis, suggesting ischemic cardiomyopathy. LA is mildly dilated. N LA thrombus, good appendage emptying velocities. Interatrial septum bows left to right consistent  with elevated LA pressure. 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 Flow velocity across the ASD is higher than expected, again suggesting elevated left atrial pressure. Moderately dilated RA. Mildly dilated RV with preserved function. No effusion. No aortic atherosclerosis. Mild MR, mild TR. Normal AV.   Patient Profile     61 y.o. male presenting with chest pain, severe dyspnea, severe HTN and AFlutter w RVR after cocaine use on Thanksgiving. Had successful TEE-DCCV on 11/29, but LVEF moderately to severely depressed (25-35%) and incidentally noted small ASD on TEE.  Assessment & Plan    1. CHF: differential includes ischemic cardiomyopathy, burned-out HTNive cardiomyopathy, tachycardia-related and acute cocaine toxicity. For Coronary CTA today. Afterwards plan to start ARB and carvedilol. 2. AFlutter: successful CV. Unclear duration (unaware of palpitations). May or may not have truly been cocaine triggered, maybe longer lasting than we thought. On apixaban. In view of depressed LVEF and dilated left atrium, will need long-term anticoagulation. CHADSVasc at least 2 (LV dysfunction, HTN). 3. ASD: has more than expected shunt due to high LA pressures/LV failure, but appears too small to be hemodynamically important under other circumstances. Not clear if it can be the cause of atrial arrhythmia. 4. HTN: fairly easy to control, not as high as would expect for it to be the cause of LV dysfunction, but likely cause of his LVH. 5. Cocaine: as far as I can tell, appears honest in his intention to stay off cocaine.  For questions or updates, please contact [facility] Please consult [email or web address] for contact info under Cardiology/STEMI.      Signed, Thurmon FairMihai Dontre Laduca, MD  04/27/2017, 8:03 AM

## 2017-04-28 LAB — BASIC METABOLIC PANEL
Anion gap: 8 (ref 5–15)
BUN: 18 mg/dL (ref 6–20)
CHLORIDE: 103 mmol/L (ref 101–111)
CO2: 24 mmol/L (ref 22–32)
Calcium: 8.7 mg/dL — ABNORMAL LOW (ref 8.9–10.3)
Creatinine, Ser: 1.29 mg/dL — ABNORMAL HIGH (ref 0.61–1.24)
GFR calc Af Amer: >60 mL/min (ref >60)
GFR calc non Af Amer: 59 mL/min — ABNORMAL LOW (ref >60)
Glucose, Bld: 92 mg/dL (ref 65–99)
POTASSIUM: 3.9 mmol/L (ref 3.5–5.1)
Sodium: 135 mmol/L (ref 135–145)

## 2017-04-28 LAB — CBC
HEMATOCRIT: 36.9 % — AB (ref 39.0–52.0)
Hemoglobin: 12 g/dL — ABNORMAL LOW (ref 13.0–17.0)
MCH: 29.9 pg (ref 26.0–34.0)
MCHC: 32.5 g/dL (ref 30.0–36.0)
MCV: 92 fL (ref 78.0–100.0)
Platelets: 461 10*3/uL — ABNORMAL HIGH (ref 150–400)
RBC: 4.01 MIL/uL — ABNORMAL LOW (ref 4.22–5.81)
RDW: 12.6 % (ref 11.5–15.5)
WBC: 7.4 10*3/uL (ref 4.0–10.5)

## 2017-04-28 LAB — MAGNESIUM: Magnesium: 2.2 mg/dL (ref 1.7–2.4)

## 2017-04-28 NOTE — Care Management Note (Signed)
Case Management Note  Patient Details  Name: Yogesh Smoker MRN: [id number] Date of Birth: [birth year]  Action/Plan: CM talked to patient about DCP; he has his own business as a Education administrator; No PCP but he plans to get one (he does not want CM to help him find a PCP), he also choose not to obtain health insurance until now ; he stated that he will enroll in an insurance plan after discharge; He is independent of all of his ADL, lives with his wife at times; CM talked to patient about the use of cocaine, ETOH, smoking; he stated that he is going to quit using drugs and "I saw the light." CM instructed to contact the Eliquis medication assistance program to see if he will qualify for any assistance;  Expected Discharge Date:    possibly 04/29/2017              Expected Discharge Plan:  Home/Self Care  In-House Referral:   Financial Counselor   Status of Service:  In process, will continue to follow  Reola Mosher [phone] 04/28/2017, 3:15 PM

## 2017-04-28 NOTE — Plan of Care (Signed)
  Education: Knowledge of General Education information will improve 04/28/2017 0403 - Completed/Met by Evert Kohl, RN   Health Behavior/Discharge Planning: Ability to manage health-related needs will improve 04/28/2017 0403 - Completed/Met by Evert Kohl, RN   Clinical Measurements: Ability to maintain clinical measurements within normal limits will improve 04/28/2017 0403 - Completed/Met by Garnett-Mellinger, Sophronia Simas, RN

## 2017-04-28 NOTE — Plan of Care (Signed)
  Pain Managment: General experience of comfort will improve 04/28/2017 0417 - Completed/Met by Evert Kohl, RN

## 2017-04-28 NOTE — Progress Notes (Signed)
PROGRESS NOTE    Roger Humphrey  KMQ:[id number] DOB: Aug 30, 1955 DOA: 04/24/2017 PCP: Patient, No Pcp Per   Brief Narrative:  61 year old BM  PMHxTobacco Abuse, Substance Abuse, EtOH abuse?Marland Kitchen Has not seen a physician in "years".     Presented to the ER on 11/27 with worsening shortness of breath for ~ 24 hrs   He reported that he socially drinks beer and usually does not do illicit drugs but smoked marijuana on Thanksgiving night.  He said that his friends did not tell him till afterwards that it was laced with cocaine.  Since, he has felt palpitations noticed that he was could not catch his breath laying down last night with occasional dry cough.  Today it continued, along with intermittent "cramps" in his left upper chest/axilla area, sweats, and acutely worsened after going to [employer].  Denies fever, chills, productive cough or hemoptysis, lower extremity swelling or pain, or syncope.   He was found to be in Aflutter with acute respiratory distress per EMS and treated with cardizem, albuterol/atrovent, and noted to be 85% on room air.  In the ER, BP 176/110, aflutter with rate 120's, ongoing severe dyspnea with rales requiring NIMV.  CXR consistent with acute pulmonary edema.  EKG showing aflutter.  Labs noted for BNP 396, troponin 1.18, sCr 1.26, and UDS + cocaine.  Cardiology consulted by EDP and started on NTG, heparin, and Cardizem gtt.  PCCM asked to admit.     Subjective: 12/1  A/O 4, negative CP, negative SOB, negative abdominal pain, negative N/V. Sitting comfortably in chair.     Assessment & Plan:   Active Problems:   Hypertensive urgency   Acute respiratory failure with hypoxia (HCC)   Acute diastolic (congestive) heart failure (HCC)   Cocaine abuse (HCC)   AKI (acute kidney injury) (HCC)   Demand ischemia of myocardium (HCC)   Substance abuse -11/27 UDS positive cocaine   HTN Emergency -Avoid unopposed beta blockade secondary to patient's cocaine use    -controlled without medication.   Atrial flutter/NSTEMI/Acute Systolic CHF   -secondary to cocaine use -Echocardiogram: systolic CHF with hypokinesis see results below -Strict in and out since admission -5.9 L -Daily weight Filed Weights   04/26/17 0500 04/27/17 0500 04/28/17 0413  Weight: 213 lb 6.5 oz (96.8 kg) 216 lb 0.8 oz (98 kg) 208 lb 1.6 oz (94.4 kg)  -Transfuse for hemoglobin<8   Elevated troponin -secondary to patient's NSTEMI. currently asymptomatic  CAD -Multivessel disease per CT coronary angiogram, see results below. Does not appear any lesion is amenable to intervention. Patient has been counseled on findings. Per cardiology note no further workup required. -schedule follow-up with Dr. Sherren Mocha cardiology within 2 weeks for NSTEMI/Acute systolic CHF. To discuss results of FFR and decide on any additional interventions.     Acute Respiratory Failure with Hypoxia -Resolved  -titrate O2 to maintain SPO2> 93%   Pulmonary edema -resolved   Acute renal failure (baseline Cr 1.1 in 05/2010) Recent Labs  Lab 04/24/17 2011 04/25/17 0302 04/26/17 0235 04/27/17 0351  CREATININE 1.26* 1.18 1.30* 1.36*  -Most likely patient's new baseline                 DVT prophylaxis: Eliquis Code Status: Full Family Communication: None Disposition Plan: TBD   Consultants:  Cardiology [facility] M     Procedures/Significant Events:  11/29 TEE ;Left ventricle: mild to   moderate LVH. -LVEF =25% to 30%. Severe diffuse hypokinesis with regional variations.   Disproportionately severe  hypokinesis of the anteroseptal and anterior myocardium; consistent with ischemia in the distribution LAD. - Left atrium: moderately dilated. Negative thrombus in the atrial cavity or appendage.  - Right atrium: moderately dilated. - Atrial septum: The septum bowed from left to right, consistent  with increased left atrial pressure. There was a small secundum atrial septal defect. Doppler  showed a moderate left-to-right   atrial level shunt. - Pericardium, extracardiac:left pleural effusion. 11/29 Echocardiogram:Left ventricle:  LVEF = 30 to 35% with hypokinesis / akinesis of the anterior and lateral walls. -Mild LVH  -Left atrium: severely dilated. -Right atrium: The atrium was mildly dilated. 11/30 CT Coronary Angiogram:-Small secundum ASD  -Coronary artery calcium score 990 Agatston units, placing the patient in the 96th percentile for age and gender. Suggests high risk for future cardiac events. - Large ramus occluded proximally. - Small AV LCx with moderate stenosis proximally and severe stenosis more distally. This vessel is unlikely to be amenable to PCI. - Nondominant RCA. -Diffuse disease in the LAD, no areas look critical. 50-60% range stenosis in the proximal LAD with up to 50% stenosis in the mid LAD and mild to moderate distal LAD stenosis.     I have personally reviewed and interpreted all radiology studies and my findings are as above.  VENTILATOR SETTINGS:    Cultures   Antimicrobials:    Devices    LINES / TUBES:      Continuous Infusions: . sodium chloride    . sodium chloride Stopped (04/26/17 1632)     Objective: Vitals:   04/27/17 1800 04/27/17 1925 04/28/17 0413 04/28/17 1231  BP: 105/69 108/70 113/76 124/78  Pulse: 69 78 74 81  Resp: (!) 23 18 18 18   Temp:  98.6 F (37 C) 98.2 F (36.8 C) 98.1 F (36.7 C)  TempSrc:  Oral Oral Oral  SpO2: 100% 100% 100% 98%  Weight:   208 lb 1.6 oz (94.4 kg)   Height:        Intake/Output Summary (Last 24 hours) at 04/28/2017 1305 Last data filed at 04/28/2017 0900 Gross per 24 hour  Intake 462 ml  Output 1225 ml  Net -763 ml   Filed Weights   04/26/17 0500 04/27/17 0500 04/28/17 0413  Weight: 213 lb 6.5 oz (96.8 kg) 216 lb 0.8 oz (98 kg) 208 lb 1.6 oz (94.4 kg)    Physical Exam:  General: A/O 4,No acute respiratory distress Neck:  Negative scars, masses, torticollis,  lymphadenopathy, JVD Lungs: Clear to auscultation bilaterally without wheezes or crackles Cardiovascular: Regular rate and rhythm without murmur gallop or rub normal S1 and S2 Abdomen: negative abdominal pain, nondistended, positive soft, bowel sounds, no rebound, no ascites, no appreciable mass Extremities: No significant cyanosis, clubbing, or edema bilateral lower extremities Skin: Negative rashes, lesions, ulcers Psychiatric:  Negative depression, negative anxiety, negative fatigue, negative mania  Central nervous system:  Cranial nerves II through XII intact, tongue/uvula midline, all extremities muscle strength 5/5, sensation intact throughout, negative dysarthria, negative expressive aphasia, negative receptive aphasia.   .     Data Reviewed: Care during the described time interval was provided by me .  I have reviewed this patient's available data, including medical history, events of note, physical examination, and all test results as part of my evaluation.   CBC: Recent Labs  Lab 04/24/17 2011 04/25/17 0302 04/26/17 0235 04/27/17 0351 04/28/17 0427  WBC 9.5 9.6 8.4 8.4 7.4  HGB 12.3* 11.4* 12.1* 12.3* 12.0*  HCT 37.1* 34.1* 36.4* 37.9* 36.9*  MCV 92.5 91.2 91.7 92.0 92.0  PLT 459* 392 443* 467* 461*   Basic Metabolic Panel: Recent Labs  Lab 04/24/17 2011 04/25/17 0302 04/26/17 0235 04/27/17 0351  NA 138 141 138 134*  K 3.7 3.8 4.2 4.1  CL 109 110 106 103  CO2 22 22 25 24   GLUCOSE 131* 114* 106* 101*  BUN 17 14 15 20   CREATININE 1.26* 1.18 1.30* 1.36*  CALCIUM 8.8* 8.6* 8.8* 8.7*  MG 2.0 1.9  --  2.3  PHOS  --  4.9*  --   --    GFR: Estimated Creatinine Clearance: 64.4 mL/min (A) (by C-G formula based on SCr of 1.36 mg/dL (H)). Liver Function Tests: Recent Labs  Lab 04/24/17 2011 04/25/17 0302 04/26/17 0235  AST 34  --  24  ALT 28  --  23  ALKPHOS 73  --  63  BILITOT 0.6  --  0.5  PROT 6.6  --  6.3*  ALBUMIN 3.3* 3.2* 3.2*   No results for  input(s): LIPASE, AMYLASE in the last 168 hours. No results for input(s): AMMONIA in the last 168 hours. Coagulation Profile: No results for input(s): INR, PROTIME in the last 168 hours. Cardiac Enzymes: Recent Labs  Lab 04/25/17 0302 04/25/17 0928 04/25/17 1354  TROPONINI 1.15* 1.82* 1.57*   BNP (last 3 results) No results for input(s): PROBNP in the last 8760 hours. HbA1C: No results for input(s): HGBA1C in the last 72 hours. CBG: Recent Labs  Lab 04/26/17 2314 04/27/17 0335 04/27/17 0732 04/27/17 1202 04/27/17 1724  GLUCAP 113* 93 103* 112* 129*   Lipid Profile: No results for input(s): CHOL, HDL, LDLCALC, TRIG, CHOLHDL, LDLDIRECT in the last 72 hours. Thyroid Function Tests: No results for input(s): TSH, T4TOTAL, FREET4, T3FREE, THYROIDAB in the last 72 hours. Anemia Panel: No results for input(s): VITAMINB12, FOLATE, FERRITIN, TIBC, IRON, RETICCTPCT in the last 72 hours. Urine analysis:    Component Value Date/Time   COLORURINE STRAW (A) 04/24/2017 2050   APPEARANCEUR CLEAR 04/24/2017 2050   LABSPEC 1.009 04/24/2017 2050   PHURINE 5.0 04/24/2017 2050   GLUCOSEU NEGATIVE 04/24/2017 2050   HGBUR NEGATIVE 04/24/2017 2050   BILIRUBINUR NEGATIVE 04/24/2017 2050   KETONESUR NEGATIVE 04/24/2017 2050   PROTEINUR NEGATIVE 04/24/2017 2050   NITRITE NEGATIVE 04/24/2017 2050   LEUKOCYTESUR NEGATIVE 04/24/2017 2050   Sepsis Labs: @LABRCNTIP (procalcitonin:4,lacticidven:4)  ) Recent Results (from the past 240 hour(s))  MRSA PCR Screening     Status: None   Collection Time: 04/25/17 12:27 AM  Result Value Ref Range Status   MRSA by PCR NEGATIVE NEGATIVE Final    Comment:        The GeneXpert MRSA Assay (FDA approved for NASAL specimens only), is one component of a comprehensive MRSA colonization surveillance program. It is not intended to diagnose MRSA infection nor to guide or monitor treatment for MRSA infections.          Radiology Studies: Ct  Coronary Morph W/cta Cor W/score W/ca W/cm &/or Wo/cm  Result Date: 04/27/2017 CLINICAL DATA:  Chest pain EXAM: Cardiac CTA MEDICATIONS: Sub lingual nitro .4mg  and lopressor 40mg  IV TECHNIQUE: The patient was scanned on a Siemens 192 slice scanner. Gantry rotation speed was 240 msecs. Collimation was 0.6 mm. A 100 kV prospective scan was triggered in the ascending thoracic aorta centered around 35-75% of the R-R interval. Average HR during the scan was 75 bpm. The 3D data set was interpreted on a dedicated work station using MPR, MIP and  VRT modes. A total of 80cc of contrast was used. FINDINGS: Non-cardiac: See separate report from [facility]. Small secundum atrial septal defect was noted. Calcium Score:  990 Agatston units. Coronary Arteries:  Left dominant with no anomalies LM:  No plaque or stenosis. LAD system: Large vessel, wraps around the apex to cover PDA territory. Mixed plaque in the ostial-proximal LAD with possible 50-60% stenosis (not severe range). There was an area of calcified plaque in the mid LAD with mild stenosis. Farther along, there was an area of soft plaque in the mid LAD with up to 50% stenosis. Mild to possibly moderate distal LAD stenosis. Circumflex: There was a large ramus that was totally occluded proximally. The AV LCx was a small caliber vessel. Moderate stenosis with mixed plaque proximally. Severe stenosis in the mid-vessel. RCA: Small, nondominant RCA. There was mixed plaque with possible mild stenosis in the mid vessel. IMPRESSION: 1.  Small secundum ASD noted. 2. Coronary artery calcium score 990 Agatston units, placing the patient in the 96th percentile for age and gender. This suggests high risk for future cardiac events. 3.  Large ramus occluded proximally. 4. Small AV LCx with moderate stenosis proximally and severe stenosis more distally. This vessel is unlikely to be amenable to PCI. 5.  Nondominant RCA. 6. Diffuse disease in the LAD, no areas look critical.  50-60% range stenosis in the proximal LAD with up to 50% stenosis in the mid LAD and mild to moderate distal LAD stenosis. I will send this study for FFR, specifically to evaluate the LAD. Dalton Mclean Electronically Signed   By: Marca Ancona M.D.   On: 04/27/2017 17:23   Dg Chest Port 1 View  Result Date: 04/27/2017 CLINICAL DATA:  61 year old male with a history of pulmonary edema EXAM: PORTABLE CHEST 1 VIEW COMPARISON:  04/26/2017 FINDINGS: Cardiomediastinal silhouette unchanged in size and contour. Improved opacity at the right lung base compare to the prior plain film. Blunting of the right costophrenic angle persists with hazy opacity overlying the right hemidiaphragm. Fullness of the central vasculature persists.  No pneumothorax. Mild interlobular septal thickening bilaterally. Improved aeration at the left lung base. IMPRESSION: Improving aeration of the bilateral lungs suggesting improving edema with small residual right greater than left pleural effusion. Electronically Signed   By: Gilmer Mor D.O.   On: 04/27/2017 09:20        Scheduled Meds: . apixaban  5 mg Oral BID  . aspirin  324 mg Oral Once  . metoprolol tartrate  25 mg Oral Once   Continuous Infusions: . sodium chloride    . sodium chloride Stopped (04/26/17 1632)     LOS: 4 days    Time spent: 40 minutes    Mahi Zabriskie, Roselind Messier, MD [facility] Pager [phone]   If 7PM-7AM, please contact night-coverage [email or web address] Password [email or web address] 04/28/2017, 1:05 PM

## 2017-04-28 NOTE — Progress Notes (Signed)
Progress Note  Patient Name: Roger BilberryMichael Corpus Date of Encounter: 04/28/2017  Primary Cardiologist: Croitoru  Subjective   No chest pain or sob.  Inpatient Medications    Scheduled Meds: . apixaban  5 mg Oral BID  . aspirin  324 mg Oral Once  . metoprolol tartrate  25 mg Oral Once   Continuous Infusions: . sodium chloride    . sodium chloride Stopped (04/26/17 1632)   PRN Meds: levalbuterol, metoprolol tartrate, sodium chloride flush   Vital Signs    Vitals:   04/27/17 1800 04/27/17 1925 04/28/17 0413 04/28/17 1231  BP: 105/69 108/70 113/76 124/78  Pulse: 69 78 74 81  Resp: (!) 23 18 18 18   Temp:  98.6 F (37 C) 98.2 F (36.8 C) 98.1 F (36.7 C)  TempSrc:  Oral Oral Oral  SpO2: 100% 100% 100% 98%  Weight:   208 lb 1.6 oz (94.4 kg)   Height:        Intake/Output Summary (Last 24 hours) at 04/28/2017 1340 Last data filed at 04/28/2017 0900 Gross per 24 hour  Intake 462 ml  Output 1225 ml  Net -763 ml   Filed Weights   04/26/17 0500 04/27/17 0500 04/28/17 0413  Weight: 213 lb 6.5 oz (96.8 kg) 216 lb 0.8 oz (98 kg) 208 lb 1.6 oz (94.4 kg)    Telemetry    nsr - Personally Reviewed  ECG    nsr - Personally Reviewed  Physical Exam   GEN: No acute distress.   Neck: 6 cm JVD Cardiac: RRR, no murmurs, rubs, or gallops.  Respiratory: Clear to auscultation bilaterally. GI: Soft, nontender, non-distended  MS: No edema; No deformity. Neuro:  Nonfocal  Psych: Normal affect   Labs    Chemistry Recent Labs  Lab 04/24/17 2011 04/25/17 0302 04/26/17 0235 04/27/17 0351  NA 138 141 138 134*  K 3.7 3.8 4.2 4.1  CL 109 110 106 103  CO2 22 22 25 24   GLUCOSE 131* 114* 106* 101*  BUN 17 14 15 20   CREATININE 1.26* 1.18 1.30* 1.36*  CALCIUM 8.8* 8.6* 8.8* 8.7*  PROT 6.6  --  6.3*  --   ALBUMIN 3.3* 3.2* 3.2*  --   AST 34  --  24  --   ALT 28  --  23  --   ALKPHOS 73  --  63  --   BILITOT 0.6  --  0.5  --   GFRNONAA >60 >60 58* 55*  GFRAA >60 >60  >60 >60  ANIONGAP 7 9 7 7      Hematology Recent Labs  Lab 04/26/17 0235 04/27/17 0351 04/28/17 0427  WBC 8.4 8.4 7.4  RBC 3.97* 4.12* 4.01*  HGB 12.1* 12.3* 12.0*  HCT 36.4* 37.9* 36.9*  MCV 91.7 92.0 92.0  MCH 30.5 29.9 29.9  MCHC 33.2 32.5 32.5  RDW 12.7 12.8 12.6  PLT 443* 467* 461*    Cardiac Enzymes Recent Labs  Lab 04/25/17 0302 04/25/17 0928 04/25/17 1354  TROPONINI 1.15* 1.82* 1.57*    Recent Labs  Lab 04/24/17 2024  TROPIPOC 1.18*     BNP Recent Labs  Lab 04/24/17 2011  BNP 396.4*     DDimer No results for input(s): DDIMER in the last 168 hours.   Radiology    Ct Coronary Morph W/cta Cor W/score W/ca W/cm &/or Wo/cm  Result Date: 04/27/2017 CLINICAL DATA:  Chest pain EXAM: Cardiac CTA MEDICATIONS: Sub lingual nitro .4mg  and lopressor 40mg  IV TECHNIQUE: The patient was  scanned on a Siemens 192 slice scanner. Gantry rotation speed was 240 msecs. Collimation was 0.6 mm. A 100 kV prospective scan was triggered in the ascending thoracic aorta centered around 35-75% of the R-R interval. Average HR during the scan was 75 bpm. The 3D data set was interpreted on a dedicated work station using MPR, MIP and VRT modes. A total of 80cc of contrast was used. FINDINGS: Non-cardiac: See separate report from [facility]. Small secundum atrial septal defect was noted. Calcium Score:  990 Agatston units. Coronary Arteries:  Left dominant with no anomalies LM:  No plaque or stenosis. LAD system: Large vessel, wraps around the apex to cover PDA territory. Mixed plaque in the ostial-proximal LAD with possible 50-60% stenosis (not severe range). There was an area of calcified plaque in the mid LAD with mild stenosis. Farther along, there was an area of soft plaque in the mid LAD with up to 50% stenosis. Mild to possibly moderate distal LAD stenosis. Circumflex: There was a large ramus that was totally occluded proximally. The AV LCx was a small caliber vessel. Moderate  stenosis with mixed plaque proximally. Severe stenosis in the mid-vessel. RCA: Small, nondominant RCA. There was mixed plaque with possible mild stenosis in the mid vessel. IMPRESSION: 1.  Small secundum ASD noted. 2. Coronary artery calcium score 990 Agatston units, placing the patient in the 96th percentile for age and gender. This suggests high risk for future cardiac events. 3.  Large ramus occluded proximally. 4. Small AV LCx with moderate stenosis proximally and severe stenosis more distally. This vessel is unlikely to be amenable to PCI. 5.  Nondominant RCA. 6. Diffuse disease in the LAD, no areas look critical. 50-60% range stenosis in the proximal LAD with up to 50% stenosis in the mid LAD and mild to moderate distal LAD stenosis. I will send this study for FFR, specifically to evaluate the LAD. Dalton Mclean Electronically Signed   By: Marca Ancona M.D.   On: 04/27/2017 17:23   Dg Chest Port 1 View  Result Date: 04/27/2017 CLINICAL DATA:  61 year old male with a history of pulmonary edema EXAM: PORTABLE CHEST 1 VIEW COMPARISON:  04/26/2017 FINDINGS: Cardiomediastinal silhouette unchanged in size and contour. Improved opacity at the right lung base compare to the prior plain film. Blunting of the right costophrenic angle persists with hazy opacity overlying the right hemidiaphragm. Fullness of the central vasculature persists.  No pneumothorax. Mild interlobular septal thickening bilaterally. Improved aeration at the left lung base. IMPRESSION: Improving aeration of the bilateral lungs suggesting improving edema with small residual right greater than left pleural effusion. Electronically Signed   By: Gilmer Mor D.O.   On: 04/27/2017 09:20    Cardiac Studies   none  Patient Profile     61 y.o. male admitted with acute CHF in setting of cocaine use  Assessment & Plan    1. Acute chest pain - CT scan noted. No additional workup 2. Acute on chronic systolic heart failure - he will be  started at DC on low dose losartan 25 mg daily 3. Atrial flutter - he is back in nsr. He will need to be discharged home on Eliquis.  4. Cocaine use - he is encouraged to avoid this.  For questions or updates, please contact [facility] Please consult [email or web address] for contact info under Cardiology/STEMI.      Signed, Lewayne Bunting, MD  04/28/2017, 1:40 PM  Patient ID: Roger Humphrey, male   DOB: [birth year], 61 y.o.  MRN: [id number]

## 2017-04-28 NOTE — Plan of Care (Signed)
  Coping: Level of anxiety will decrease 04/28/2017 0534 - Completed/Met by Evert Kohl, RN

## 2017-04-28 NOTE — Progress Notes (Signed)
Refused bed alarm. Will continue to monitor patient. 

## 2017-04-29 ENCOUNTER — Encounter (HOSPITAL_COMMUNITY): Payer: Self-pay | Admitting: *Deleted

## 2017-04-29 DIAGNOSIS — I214 Non-ST elevation (NSTEMI) myocardial infarction: Secondary | ICD-10-CM

## 2017-04-29 DIAGNOSIS — I2510 Atherosclerotic heart disease of native coronary artery without angina pectoris: Secondary | ICD-10-CM

## 2017-04-29 DIAGNOSIS — I5021 Acute systolic (congestive) heart failure: Secondary | ICD-10-CM

## 2017-04-29 DIAGNOSIS — N179 Acute kidney failure, unspecified: Secondary | ICD-10-CM

## 2017-04-29 LAB — CBC
HCT: 37 % — ABNORMAL LOW (ref 39.0–52.0)
HEMOGLOBIN: 12.1 g/dL — AB (ref 13.0–17.0)
MCH: 30 pg (ref 26.0–34.0)
MCHC: 32.7 g/dL (ref 30.0–36.0)
MCV: 91.8 fL (ref 78.0–100.0)
PLATELETS: 463 10*3/uL — AB (ref 150–400)
RBC: 4.03 MIL/uL — AB (ref 4.22–5.81)
RDW: 12.6 % (ref 11.5–15.5)
WBC: 6.4 10*3/uL (ref 4.0–10.5)

## 2017-04-29 MED ORDER — APIXABAN 5 MG PO TABS: 5.0000 mg | ORAL_TABLET | Freq: Two times a day (BID) | ORAL | 0 refills | Status: DC

## 2017-04-29 NOTE — Clinical Social Work Note (Signed)
Clinical Social Work Assessment  Patient Details  Name: Roger Humphrey MRN: [id number] Date of Birth: Nov 01, 1955  Date of referral:  04/29/17               Reason for consult:  Substance Use/ETOH Abuse                Permission sought to share information with:    Permission granted to share information::  Yes, Verbal Permission Granted  Name::        Agency::     Relationship::     Contact Information:     Housing/Transportation Living arrangements for the past 2 months:  Apartment Source of Information:  Patient Patient Interpreter Needed:  None Criminal Activity/Legal Involvement Pertinent to Current Situation/Hospitalization:  No - Comment as needed Significant Relationships:  Spouse Lives with:  Spouse Do you feel safe going back to the place where you live?  Yes Need for family participation in patient care:  No (Coment)  Care giving concerns:  Patient feels he needs to go to substance abuse treatment.   Social Worker assessment / plan:  Patient is a 61 year old male who is married and has his own painting business.  Patient states he feels like he is ready for substance abuse rehab.  CSW explained to patient that he will have to call different treatment centers himself and find out if they take Medicaid.  CSW provided a list of different agencies in the surrounding area.  Patient expressed that he feels now is the time to start getting treatment.  CSW informed patient that the hospital does not discharge patients to substance abuse rehab.  Patient was appreciative of resources given.  Patient did not have any other questions or concerns.  Employment status:  Games developer:  [place] PT Recommendations:  Not assessed at this time Information / Referral to community resources:  Residential Substance Abuse Treatment Options, Outpatient Substance Abuse Treatment Options  Patient/Family's Response to care:  Patient feels he is ready to go to rehab  for substance abuse treatment.  Patient/Family's Understanding of and Emotional Response to Diagnosis, Current Treatment, and Prognosis: Patient feels like now is the time and he does want to go to rehab.  List of resources were given to him.   Emotional Assessment Appearance:  Appears stated age Attitude/Demeanor/Rapport:    Affect (typically observed):  Appropriate, Calm, Stable Orientation:  Oriented to Self, Oriented to Place, Oriented to  Time, Oriented to Situation Alcohol / Substance use:  Alcohol Use, Illicit Drugs Psych involvement (Current and /or in the community):  No (Comment)  Discharge Needs  Concerns to be addressed:  Substance Abuse Concerns Readmission within the last 30 days:  No Current discharge risk:  Substance Abuse Barriers to Discharge:  Continued Medical Work up   Darleene Cleaver, LCSWA 04/29/2017, 1:06 PM

## 2017-04-29 NOTE — Care Management (Signed)
Pt states has Eliquis card.  [facility] and [facility] info on AVS.  Pt understand to call for PCP appt and will be able to use pharmacy services when care established.  Pt understands to f/u with cardiologist for pt assistance program during the next month for Eliquis assistance.

## 2017-04-29 NOTE — Discharge Summary (Signed)
Physician Discharge Summary  Kemarri Humphrey NFA:[id number] DOB: 1956-05-29 DOA: 04/24/2017  PCP: Patient, No Pcp Per  Admit date: 04/24/2017 Discharge date: 04/29/2017  Time spent: 35 minutes  Recommendations for Outpatient Follow-up:  Substance abuse -11/27 UDS positive cocaine   HTN Emergency -Eliquis 5 mg BID: Arrange for NCM Roger Humphrey to see patient and assist in obtaining PCP, and assistance with medication patient refused all help -Controlled without medication   Atrial flutter/NSTEMI/Acute Systolic CHF   -secondary to cocaine use -Echocardiogram: systolic CHF with hypokinesis see results below -Strict in and out since admission -5.9 L -Daily weight      Filed Weights    04/26/17 0500 04/27/17 0500 04/28/17 0413  Weight: 213 lb 6.5 oz (96.8 kg) 216 lb 0.8 oz (98 kg) 208 lb 1.6 oz (94.4 kg)  -Patient to weigh himself daily and maintain log book     CAD -Multivessel disease per CT coronary angiogram, see results below. Does not appear any lesion is amenable to intervention. Patient has been counseled on findings. Per cardiology note no further workup required. -schedule follow-up with Roger Humphrey cardiology within 2 weeks for NSTEMI/Acute systolic CHF. To discuss results of FFR and decide on any additional interventions.      Acute Respiratory Failure with Hypoxia -Resolved    Pulmonary edema -Results   Acute renal failure (baseline Cr 1.1 in 05/2010) Recent Labs  Lab 04/24/17 2011 04/25/17 0302 04/26/17 0235 04/27/17 0351 04/28/17 1458  CREATININE 1.26* 1.18 1.30* 1.36* 1.29*  -Most likely patient's new baseline        Discharge Diagnoses:  Active Problems:   Hypertensive urgency   Acute respiratory failure with hypoxia (HCC)   Acute diastolic (congestive) heart failure (HCC)   Cocaine abuse (HCC)   AKI (acute kidney injury) (HCC)   Demand ischemia of myocardium [facility])   Discharge Condition: Stable  Diet recommendation: Heart  healthy  Filed Weights   04/27/17 0500 04/28/17 0413 04/29/17 0500  Weight: 216 lb 0.8 oz (98 kg) 208 lb 1.6 oz (94.4 kg) 208 lb 4.8 oz (94.5 kg)    History of present illness:  61 year old BM  PMHxTobacco Abuse, Substance Abuse, EtOH abuse?Marland Kitchen Has not seen a physician in "years".     Presented to the ER on 11/27 with worsening shortness of breath for ~ 24 hrs   He reported that he socially drinks beer and usually does not do illicit drugs but smoked marijuana on Thanksgiving night.  He said that his friends did not tell him till afterwards that it was laced with cocaine.  Since, he has felt palpitations noticed that he was could not catch his breath laying down last night with occasional dry cough.  Today it continued, along with intermittent "cramps" in his left upper chest/axilla area, sweats, and acutely worsened after going to [employer].  Denies fever, chills, productive cough or hemoptysis, lower extremity swelling or pain, or syncope.   He was found to be in Aflutter with acute respiratory distress per EMS and treated with cardizem, albuterol/atrovent, and noted to be 85% on room air.  In the ER, BP 176/110, aflutter with rate 120's, ongoing severe dyspnea with rales requiring NIMV.  CXR consistent with acute pulmonary edema.  EKG showing aflutter.  Labs noted for BNP 396, troponin 1.18, sCr 1.26, and UDS + cocaine.  Cardiology consulted by EDP and started on NTG, heparin, and Cardizem gtt.  PCCM asked to admit.   During this admission patient evaluated for NSTEMI found to  have acute systolic CHF and atrial flutter secondary to cocaine abuse. Currently patient's BP under control. Echocardiogram showed multivessel disease will follow-up with cardiology as above.    Procedures: 11/29 TEE ;Left ventricle: mild to moderate LVH. -LVEF =25% to 30%. Severe diffuse hypokinesis with regional variations. Disproportionately severe hypokinesis of the anteroseptal and anterior myocardium; consistent  with ischemia in the distribution LAD. - Left atrium: moderately dilated. Negative thrombus in the atrial cavity or appendage.  - Right atrium: moderately dilated. - Atrial septum: The septum bowed from left to right, consistent with increased left atrial pressure. There was a small secundum atrial septal defect. Doppler showed a moderate left-to-right atrial level shunt. - Pericardium, extracardiac:left pleural effusion. 11/29 Echocardiogram:Left ventricle: LVEF = 30 to 35% with hypokinesis / akinesis of the anterior and lateral walls. -Mild LVH  -Left atrium: severely dilated. -Right atrium: The atrium was mildly dilated. 11/30 CT Coronary Angiogram:-Small secundum ASD  -Coronary artery calcium score 990 Agatston units, placing the patient in the 96th percentile for age and gender. Suggests high risk for future cardiac events. -Large ramus occluded proximally. - Small AV LCx with moderate stenosis proximally and severe stenosis more distally. This vessel is unlikely to be amenable to PCI. -Nondominant RCA. -Diffuse disease in the LAD, no areas look critical. 50-60% range stenosis in the proximal LAD with up to 50% stenosis in the mid LAD and mild to moderate distal LAD stenosis.      Consultations: Cardiology PC CM    Discharge Exam: Vitals:   04/28/17 1231 04/28/17 1941 04/29/17 0500 04/29/17 1156  BP: 124/78 121/79 129/87 (!) 132/99  Pulse: 81 87 76 70  Resp: 18 18 18 18   Temp: 98.1 F (36.7 C) 98.9 F (37.2 C) 98.7 F (37.1 C) 98.6 F (37 C)  TempSrc: Oral Oral Oral Oral  SpO2: 98% 98% 98% 99%  Weight:   208 lb 4.8 oz (94.5 kg)   Height:        General: A/O 4,No acute respiratory distress Neck:  Negative scars, masses, torticollis, lymphadenopathy, JVD Lungs: Clear to auscultation bilaterally without wheezes or crackles Cardiovascular: Regular rate and rhythm without murmur gallop or rub normal S1 and S2  Discharge Instructions   Allergies as of  04/29/2017   No Known Allergies     Medication List    STOP taking these medications   multivitamin with minerals Tabs tablet     TAKE these medications   apixaban 5 MG Tabs tablet Commonly known as:  ELIQUIS Take 1 tablet (5 mg total) by mouth 2 (two) times daily.      No Known Allergies Follow-up Information    [facility] Follow up.   Why:  Please call to arrange Primary Care Physician Appt after discharge  Contact information: [address] [place] [address] [phone]       [facility] Follow up.   Why:  Please call to arrange Primary Care Physician Appt after discharge  Contact information: [address] [place] [address]       Marinus Maw, MD. Schedule an appointment as soon as possible for a visit in 2 week(s).   Specialty:  Cardiology Why:  schedule follow-up with Roger Humphrey cardiology within 2 weeks for NSTEMI/Acute systolic CHF. To discuss results of FFR and decide on any additional interventions. Contact information: [address] [place] [address] [phone]  The results of significant diagnostics from this hospitalization (including imaging, microbiology, ancillary and laboratory) are listed below for reference.    Significant Diagnostic Studies: Ct Coronary Morph W/cta Cor W/score W/ca W/cm &/or Wo/cm  Result Date: 04/27/2017 CLINICAL DATA:  Chest pain EXAM: Cardiac CTA MEDICATIONS: Sub lingual nitro .4mg  and lopressor 40mg  IV TECHNIQUE: The patient was scanned on a Siemens 192 slice scanner. Gantry rotation speed was 240 msecs. Collimation was 0.6 mm. A 100 kV prospective scan was triggered in the ascending thoracic aorta centered around 35-75% of the R-R interval. Average HR during the scan was 75 bpm. The 3D data set was interpreted on a dedicated work station using MPR, MIP and VRT modes. A total of 80cc of  contrast was used. FINDINGS: Non-cardiac: [facility]. Small secundum atrial septal defect was noted. Calcium Score:  990 Agatston units. Coronary Arteries:  Left dominant with no anomalies LM:  No plaque or stenosis. LAD system: Large vessel, wraps around the apex to cover PDA territory. Mixed plaque in the ostial-proximal LAD with possible 50-60% stenosis (not severe range). There was an area of calcified plaque in the mid LAD with mild stenosis. Farther along, there was an area of soft plaque in the mid LAD with up to 50% stenosis. Mild to possibly moderate distal LAD stenosis. Circumflex: There was a large ramus that was totally occluded proximally. The AV LCx was a small caliber vessel. Moderate stenosis with mixed plaque proximally. Severe stenosis in the mid-vessel. RCA: Small, nondominant RCA. There was mixed plaque with possible mild stenosis in the mid vessel. IMPRESSION: 1.  Small secundum ASD noted. 2. Coronary artery calcium score 990 Agatston units, placing the patient in the 96th percentile for age and gender. This suggests high risk for future cardiac events. 3.  Large ramus occluded proximally. 4. Small AV LCx with moderate stenosis proximally and severe stenosis more distally. This vessel is unlikely to be amenable to PCI. 5.  Nondominant RCA. 6. Diffuse disease in the LAD, no areas look critical. 50-60% range stenosis in the proximal LAD with up to 50% stenosis in the mid LAD and mild to moderate distal LAD stenosis. I will send this study for FFR, specifically to evaluate the LAD. Dalton Mclean Electronically Signed   By: Marca Anconaalton  Mclean M.D.   On: 04/27/2017 17:23   Dg Chest Port 1 View  Result Date: 04/27/2017 CLINICAL DATA:  61 year old male with a history of pulmonary edema EXAM: PORTABLE CHEST 1 VIEW COMPARISON:  04/26/2017 FINDINGS: Cardiomediastinal silhouette unchanged in size and contour. Improved opacity at the right lung base compare to the prior  plain film. Blunting of the right costophrenic angle persists with hazy opacity overlying the right hemidiaphragm. Fullness of the central vasculature persists.  No pneumothorax. Mild interlobular septal thickening bilaterally. Improved aeration at the left lung base. IMPRESSION: Improving aeration of the bilateral lungs suggesting improving edema with small residual right greater than left pleural effusion. Electronically Signed   By: Gilmer MorJaime  Wagner D.O.   On: 04/27/2017 09:20   Dg Chest Port 1 View  Result Date: 04/26/2017 CLINICAL DATA:  Pulmonary edema EXAM: PORTABLE CHEST 1 VIEW COMPARISON:  04/25/2017 FINDINGS: Diffuse bilateral airspace disease with basilar predominance is mildly improved. Bilateral pleural effusions and bibasilar atelectasis unchanged. Cardiac enlargement. IMPRESSION: Congestive heart failure with diffuse edema. Mild interval improvement in edema. Electronically Signed   By: Marlan Palauharles  Clark M.D.   On: 04/26/2017 06:58   [facility]  Result Date: 04/25/2017 CLINICAL DATA:  61 year old male presenting with shortness of Breath, presenting an Hypertensive emergency with pulmonary edema. Atrial flutter. EXAM: PORTABLE CHEST 1 VIEW COMPARISON:  04/24/2017. FINDINGS: Portable AP semi upright view at 0414 hours. Bilateral veiling pulmonary opacity in obscuration of the diaphragm. Dense retrocardiac opacity appears increased. Stable cardiomegaly and mediastinal contours. Minimally improved apical lung ventilation and decreased vascularity. Visualized tracheal air column is within normal limits. IMPRESSION: Continued widespread pulmonary edema with suspected bilateral pleural effusions, and bilateral lower lobe collapse/ consolidation. Electronically Signed   By: Odessa Fleming M.D.   On: 04/25/2017 06:48   Dg Chest Port 1 View  Result Date: 04/24/2017 CLINICAL DATA:  Severe shortness of breath. EXAM: PORTABLE CHEST 1 VIEW COMPARISON:  Chest x-ray dated June 11, 2010. FINDINGS: Mild  cardiomegaly. Normal pulmonary vascularity. Diffuse consolidative opacities in the right greater than left lungs. Small right pleural effusion. No pneumothorax. No acute osseous abnormality. IMPRESSION: 1. Diffuse consolidative opacities in the right greater than left lungs, which could reflect ARDS, edema, or hemorrhage. Electronically Signed   By: Obie Dredge M.D.   On: 04/24/2017 20:43    Microbiology: Recent Results (from the past 240 hour(s))  MRSA PCR Screening     Status: None   Collection Time: 04/25/17 12:27 AM  Result Value Ref Range Status   MRSA by PCR NEGATIVE NEGATIVE Final    Comment:        The GeneXpert MRSA Assay (FDA approved for NASAL specimens only), is one component of a comprehensive MRSA colonization surveillance program. It is not intended to diagnose MRSA infection nor to guide or monitor treatment for MRSA infections.      Labs: Basic Metabolic Panel: Recent Labs  Lab 04/24/17 2011 04/25/17 0302 04/26/17 0235 04/27/17 0351 04/28/17 1458  NA 138 141 138 134* 135  K 3.7 3.8 4.2 4.1 3.9  CL 109 110 106 103 103  CO2 22 22 25 24 24   GLUCOSE 131* 114* 106* 101* 92  BUN 17 14 15 20 18   CREATININE 1.26* 1.18 1.30* 1.36* 1.29*  CALCIUM 8.8* 8.6* 8.8* 8.7* 8.7*  MG 2.0 1.9  --  2.3 2.2  PHOS  --  4.9*  --   --   --    Liver Function Tests: Recent Labs  Lab 04/24/17 2011 04/25/17 0302 04/26/17 0235  AST 34  --  24  ALT 28  --  23  ALKPHOS 73  --  63  BILITOT 0.6  --  0.5  PROT 6.6  --  6.3*  ALBUMIN 3.3* 3.2* 3.2*   No results for input(s): LIPASE, AMYLASE in the last 168 hours. No results for input(s): AMMONIA in the last 168 hours. CBC: Recent Labs  Lab 04/25/17 0302 04/26/17 0235 04/27/17 0351 04/28/17 0427 04/29/17 0441  WBC 9.6 8.4 8.4 7.4 6.4  HGB 11.4* 12.1* 12.3* 12.0* 12.1*  HCT 34.1* 36.4* 37.9* 36.9* 37.0*  MCV 91.2 91.7 92.0 92.0 91.8  PLT 392 443* 467* 461* 463*   Cardiac Enzymes: Recent Labs  Lab 04/25/17 0302  04/25/17 0928 04/25/17 1354  TROPONINI 1.15* 1.82* 1.57*   BNP: BNP (last 3 results) Recent Labs    04/24/17 2011  BNP 396.4*    ProBNP (last 3 results) No results for input(s): PROBNP in the last 8760 hours.  CBG: Recent Labs  Lab 04/26/17 2314 04/27/17 0335 04/27/17 0732 04/27/17 1202 04/27/17 1724  GLUCAP 113* 93 103* 112* 129*       Signed:  Dia Crawford, MD [facility] [phone] pager

## 2017-04-29 NOTE — Care Management CC44 (Deleted)
Condition Code 44 Documentation Completed  Patient Details  Name: Santhosh Ranft MRN: [id number] Date of Birth: [birth year]   Condition Code 44 given:  Yes Patient signature on Condition Code 44 notice:  Yes Documentation of 2 MD's agreement:  Yes Code 44 added to claim:  Yes    Verdene Lennert, RN 04/29/2017, 4:09 PM

## 2017-04-29 NOTE — Plan of Care (Signed)
  Clinical Measurements: Diagnostic test results will improve 04/29/2017 0432 - Completed/Met by Evert Kohl, RN

## 2017-04-29 NOTE — Clinical Social Work Note (Signed)
CSW spoke to patient to discuss substance abuse treatment programs.  Patient expressed that he is interested in considering substance abuse treatment either as an outpatient or residential.  CSW provided a list of different options, CSW explained to patient that he would have to contact facilities himself and say he wants to go in order for the facility to review patient.  Patient expressed gratitude for CSW giving him information about different substance abuse facilities.  CSW to sign off please reconsult if other social work needs arise.  Roger Humphrey. Yida Hyams, MSW, Theresia Majors [phone]  04/29/2017 1:02 PM

## 2017-04-29 NOTE — Care Management Obs Status (Deleted)
MEDICARE OBSERVATION STATUS NOTIFICATION   Patient Details  Name: Roger Humphrey MRN: [id number] Date of Birth: 1955/11/30   Medicare Observation Status Notification Given:  Yes    Verdene Lennert, RN 04/29/2017, 4:08 PM

## 2017-04-29 NOTE — Progress Notes (Signed)
Discharged patient home.  Reviewed discharge instructions and sent prescription and instructions home with patient.

## 2017-05-09 ENCOUNTER — Telehealth: Payer: Self-pay | Admitting: Physician Assistant

## 2017-05-09 NOTE — Telephone Encounter (Signed)
Pt wants to know if he can stop taking Eliquis? His teeth and gums are swollen so bad and they are really hurting bad.

## 2017-05-10 NOTE — Telephone Encounter (Signed)
Spoke with patient - he states swelling in gums bad enough that he is having trouble eating.   Is afraid to stop because of concerns of blood clots.     Advised that he work on Administrator for the next week to help with his gums. But he can hold 1-2 doses if things do not improve.  He is scheduled to see MD next week and encouraged that he not miss this appointment.

## 2017-05-14 ENCOUNTER — Telehealth: Payer: Self-pay | Admitting: Cardiovascular Disease

## 2017-05-14 ENCOUNTER — Encounter: Payer: Self-pay | Admitting: Physician Assistant

## 2017-05-14 MED ORDER — FUROSEMIDE 20 MG PO TABS: 20.0000 mg | ORAL_TABLET | Freq: Every day | ORAL | 0 refills | Status: DC

## 2017-05-14 NOTE — Progress Notes (Signed)
Cardiology Office Note   Date:  05/15/2017   ID:  Roger Humphrey, DOB 04/25/1956, MRN [id number]  PCP:  Patient, No Pcp Per  Cardiologist: Dr. Royann Shiversroitoru, in hospital, 04/27/2017 Theodore Demarkhonda Azan Maneri, PA-C   Chief Complaint  Patient presents with  . Follow-up    History of Present Illness: Roger BilberryMichael Shuck is a 61 y.o. male with a history of HTN, cocaine use  Admitted 11/27-12/06/2016 with acute systolic CHF, non-STEMI with medical therapy recommended for multivessel CAD, atrial flutter (started on Eliquis)  Roger BilberryMichael Gariepy presents for cardiology follow up.  He has quit tobacco and has not done cocaine since his admission.  His main problem is DOE. He feels like his activity is very limited by his breathing. Phone notes describe orthopnea and PND. No LE edema. He was given Lasix last pm and slept better.   No chest pain.   No palpitations, no presyncope or syncope.  He does not eat out. He is not frying foods. He does not have scales. He is not reading labels for sodium.   He is willing to continue the lifestyle changes to get better.   He was having problems with gum swelling after d/c, thought it was the Eliquis. Phone notes from pharmacist>>pt did as requested and sx have stopped.    Past Medical History:  Diagnosis Date  . Acute systolic CHF (congestive heart failure) (HCC) 04/24/2017  . Atrial flutter, paroxysmal (HCC) 03/2017  . Chronic anticoagulation 03/2017   Eliquis  . Hypertension    Past Surgical History:  Procedure Laterality Date  . FACIAL LACERATION REPAIR  05/2010     Current Outpatient Medications  Medication Sig Dispense Refill  . apixaban (ELIQUIS) 5 MG TABS tablet Take 1 tablet (5 mg total) by mouth 2 (two) times daily. 60 tablet 0  . furosemide (LASIX) 20 MG tablet Take 1 tablet (20 mg total) by mouth daily. 5 tablet 0   No current facility-administered medications for this visit.     Allergies:   Patient has no known allergies.     Social History:  The patient  reports that he quit smoking about 4 weeks ago. His smoking use included cigarettes. He started smoking about 12 years ago. he has never used smokeless tobacco. He reports that he uses drugs.   Family History:  The patient's family history includes Heart murmur in his father; Hypertension in his father.    ROS:  Please see the history of present illness. All other systems are reviewed and negative.    PHYSICAL EXAM: VS:  BP (!) 144/102   Pulse 90   Ht 5\' 8"  (1.727 m)   Wt 226 lb (102.5 kg)   BMI 34.36 kg/m  , BMI Body mass index is 34.36 kg/m. GEN: Well nourished, well developed, male in no acute distress  HEENT: normal for age  Neck: JVD 9 cm, no carotid bruit, no masses Cardiac: RRR; soft murmur, no rubs, or gallops Respiratory: decreased BS bases w/ some rales bilaterally, normal work of breathing GI: soft, nontender, nondistended, + BS MS: no deformity or atrophy; trace edema; distal pulses are 2+ in all 4 extremities   Skin: warm and dry, no rash Neuro:  Strength and sensation are intact Psych: euthymic mood, full affect   EKG:  EKG is ordered today. The ekg ordered today demonstrates SR, HR 90  11/29 TEE ;Left ventricle: mild to moderate LVH. -LVEF =25% to 30%. Severe diffuse hypokinesis with regional variations. Disproportionately severe hypokinesis of the anteroseptal  and anterior myocardium; consistent with ischemia in the distribution LAD. - Left atrium: moderately dilated. Negative thrombus in the atrial cavity or appendage.  - Right atrium: moderately dilated. - Atrial septum: The septum bowed from left to right, consistent with increased left atrial pressure. There was a small secundum atrial septal defect. Doppler showed a moderate left-to-right atrial level shunt. - Pericardium, extracardiac:left pleural effusion.  11/29 Echocardiogram:Left ventricle: LVEF = 30 to 35% with hypokinesis / akinesis of the anterior and  lateral walls. -Mild LVH  -Left atrium: severely dilated. -Right atrium: The atrium was mildly dilated.  11/30 CT Coronary Angiogram:-Small secundum ASD -Coronary artery calcium score 990 Agatston units, placing the patient in the 96th percentile for age and gender.Suggestshigh risk for future cardiac events. -Large ramus occluded proximally. -Small AV LCx with moderate stenosis proximally and severe stenosis more distally. This vessel is unlikely to be amenable to PCI. -Nondominant RCA. -Diffuse disease in the LAD, no areas look critical. 50-60% range stenosis in the proximal LAD with up to 50% stenosis in the mid LAD and mild to moderate distal LAD stenosis.   Recent Labs: 04/24/2017: B Natriuretic Peptide 396.4; TSH 0.564 04/26/2017: ALT 23 04/28/2017: BUN 18; Creatinine, Ser 1.29; Magnesium 2.2; Potassium 3.9; Sodium 135 04/29/2017: Hemoglobin 12.1; Platelets 463    Lipid Panel No results found for: CHOL, TRIG, HDL, CHOLHDL, VLDL, LDLCALC, LDLDIRECT   Wt Readings from Last 3 Encounters:  05/15/17 226 lb (102.5 kg)  04/29/17 208 lb 4.8 oz (94.5 kg)     Other studies Reviewed: Additional studies/ records that were reviewed today include: hospital records and testing.  ASSESSMENT AND PLAN:  1.  Acute systolic CHF: He is wearing heavier clothes and shoes and some of the weight increase may be from that.  However, he has volume overload by exam and is short of breath with exertion.   He is Lasix nave, so Lasix 20 mg a day may be all he needs.  Add potassium as well.  Check a BMET today and then at his next appointment.  Add a beta-blocker today and if his renal function is stable with diuresis, add an ARB with an eye to eventual Entresto.  I will see if I can get a set of scales from the CHF clinic  2.  Hypertension: His blood pressure is elevated.  The Lasix will help and we will also add carvedilol 6.25 mg twice daily.  3.  Paroxysmal atrial flutter: He is in sinus rhythm  today.  Add a beta-blocker and continue anticoagulation.  4.  Chronic anticoagulation: He states he will not be able to afford the Eliquis.  We need to look into what assistance is available.  5.  CAD: He is not on aspirin because of the Eliquis.  He is not having any ischemic symptoms.  Add a beta-blocker.  He will need to be on a statin, this can be added after diuresis when we can get fasting labs.  Current medicines are reviewed at length with the patient today.  The patient has concerns regarding medicines.  The following changes have been made: Add carvedilol, make sure the Lasix is daily and add potassium  Labs/ tests ordered today include:   Orders Placed This Encounter  Procedures  . Basic metabolic panel  . Basic metabolic panel  . EKG 12-Lead     Disposition:   FU with Dr. [name]  Signed, Theodore Demark, PA-C  05/15/2017 11:42 AM    [facility] Phone: [phone]; Fax: [phone]  This note was written with the assistance of speech recognition software. Please excuse any transcriptional errors.

## 2017-05-14 NOTE — Telephone Encounter (Signed)
Spoke with pt, he reports SOB with exertion when walking to the mailbox. No SOB with ADL's or exertion within the home. He denies edema and reports this started 3 days ago and is new since discharge from the hospital. He reports orthopnea, having to use 3 pillows to sleep last night. He does not weigh but reports coughing at night when trying to rest. He has a follow up appointment tomorrow and feels he will be fine to wait for that appointment. Will discuss with rhonda barrett pa if furosemide dose today is needed.

## 2017-05-14 NOTE — Telephone Encounter (Signed)
Spoke with pt he does not sound SOB when speaking he states that he is only SOB when walking to the mailbox because there is a slight incline and also when he is lying down at night he will awaken d/t coughing at this time he will sit up and the SOB will alleviate and then he will fall back asleep again. (still call in lasix 20mg  #5-Per Bjorn Loser and make sure that pt comes in for appt tomorrow) pt notified to take rx for Lasix 20mg  now and notified to make sure to come in for scheduled appt tomorrow to be evaluated.

## 2017-05-14 NOTE — Telephone Encounter (Signed)
Per Bjorn Loser: If pt sounds SOB, ok to send rx for lasix 20mg  #5 and take one today and follow up tomorrow.

## 2017-05-14 NOTE — Telephone Encounter (Signed)
New Message      Pt c/o Shortness Of Breath: STAT if SOB developed within the last 24 hours or pt is noticeably SOB on the phone  1. Are you currently SOB (can you hear that pt is SOB on the phone)?  no  2. How long have you been experiencing SOB?  A couple day   3. Are you SOB when sitting or when up moving around? With exertion   4. Are you currently experiencing any other symptoms? Fatigue

## 2017-05-15 ENCOUNTER — Encounter: Payer: Self-pay | Admitting: Physician Assistant

## 2017-05-15 ENCOUNTER — Telehealth: Payer: Self-pay | Admitting: Physician Assistant

## 2017-05-15 ENCOUNTER — Ambulatory Visit (INDEPENDENT_AMBULATORY_CARE_PROVIDER_SITE_OTHER): Payer: Self-pay | Admitting: Physician Assistant

## 2017-05-15 VITALS — BP 144/102 | HR 90 | Ht 68.0 in | Wt 226.0 lb

## 2017-05-15 DIAGNOSIS — I214 Non-ST elevation (NSTEMI) myocardial infarction: Secondary | ICD-10-CM

## 2017-05-15 DIAGNOSIS — I10 Essential (primary) hypertension: Secondary | ICD-10-CM

## 2017-05-15 DIAGNOSIS — I5021 Acute systolic (congestive) heart failure: Secondary | ICD-10-CM

## 2017-05-15 DIAGNOSIS — I4892 Unspecified atrial flutter: Secondary | ICD-10-CM

## 2017-05-15 DIAGNOSIS — Z7901 Long term (current) use of anticoagulants: Secondary | ICD-10-CM

## 2017-05-15 LAB — BASIC METABOLIC PANEL
BUN/Creatinine Ratio: 16 (ref 10–24)
BUN: 14 mg/dL (ref 8–27)
CO2: 21 mmol/L (ref 20–29)
CREATININE: 0.9 mg/dL (ref 0.76–1.27)
Calcium: 9.6 mg/dL (ref 8.6–10.2)
Chloride: 105 mmol/L (ref 96–106)
GFR calc Af Amer: 107 mL/min/{1.73_m2} (ref >59)
GFR, EST NON AFRICAN AMERICAN: 93 mL/min/{1.73_m2} (ref >59)
GLUCOSE: 95 mg/dL (ref 65–99)
POTASSIUM: 4.4 mmol/L (ref 3.5–5.2)
Sodium: 143 mmol/L (ref 134–144)

## 2017-05-15 MED ORDER — CARVEDILOL 6.25 MG PO TABS: 6.2500 mg | ORAL_TABLET | Freq: Two times a day (BID) | ORAL | 3 refills | Status: DC

## 2017-05-15 MED ORDER — POTASSIUM CHLORIDE ER 10 MEQ PO TBCR: 10.0000 meq | EXTENDED_RELEASE_TABLET | Freq: Every day | ORAL | 3 refills | Status: DC

## 2017-05-15 MED ORDER — FUROSEMIDE 20 MG PO TABS: 20.0000 mg | ORAL_TABLET | Freq: Every day | ORAL | 3 refills | Status: DC

## 2017-05-15 NOTE — Telephone Encounter (Signed)
New Message  Pt wife would  to know if pt needs to continue taking lasix if so he will need a new prescription. Please call back to discuss

## 2017-05-15 NOTE — Telephone Encounter (Signed)
Patient advised refill was sent as requested.

## 2017-05-15 NOTE — Patient Instructions (Addendum)
Medication Instructions: CONTINUE Furosemide 20 mg daily START Potassium 10 meq daily START Carvedilol 6.25 mg tablet twice daily  If you need a refill on your cardiac medications before your next appointment, please call your pharmacy.   Labwork: Your physician recommends that you have a BMET drawn today and then again at your next follow up appointment.   Follow-Up: Your physician wants you to follow-up in: 2 weeks with Theodore Demark, PA or Dr. [name].   Special Instructions:  * Please start walking 5 minutes twice a day and then work your way up to 30 minutes daily. As you work your way up to 30 minutes a day, incorporate a few stairs in the daily walk. * Please limit your sodium to 2000 mg daily * Limit your fluid intake to 2 liters. This includes all fluid.    Thank you for choosing [facility] at [facility]!!

## 2017-05-16 NOTE — Progress Notes (Signed)
Please ask Belenda Cruise or Raquel to see if he qualifies for assistance for [employer]

## 2017-05-18 ENCOUNTER — Telehealth: Payer: Self-pay | Admitting: *Deleted

## 2017-05-18 NOTE — Telephone Encounter (Signed)
Patient called and made aware that Eliquis Assistance papers have been mailed to him. He will fill out the appropriate papers and completed the documents needed and return them to the office.

## 2017-05-23 ENCOUNTER — Telehealth: Payer: Self-pay | Admitting: *Deleted

## 2017-05-23 NOTE — Telephone Encounter (Signed)
-----   Message from Darrol Jump, PA-C sent at 05/17/2017  9:53 AM EST ----- Please let him know that his labs were fine.  His kidney function is normal. Make sure that his breathing has improved, if not, increase the Lasix to 40 mg a day. I will try to get him scales from the heart failure clinic.

## 2017-05-23 NOTE — Telephone Encounter (Signed)
Left a message to call back for lab results and instructions.

## 2017-05-30 ENCOUNTER — Telehealth: Payer: Self-pay | Admitting: Physician Assistant

## 2017-05-30 NOTE — Telephone Encounter (Signed)
Patient calling the office for samples of medication: ° ° °1.  What medication and dosage are you requesting samples for? ° °apixaban (ELIQUIS) 5 MG TABS tablet ° °2.  Are you currently out of this medication?  yes ° ° °

## 2017-05-31 MED ORDER — APIXABAN 5 MG PO TABS: 5.0000 mg | ORAL_TABLET | Freq: Two times a day (BID) | ORAL | 2 refills | Status: DC

## 2017-05-31 NOTE — Telephone Encounter (Signed)
Pt out of Eliquis. Med refill sent to pharmacy, as well as that I have informed patient samples are available for pickup in office. He voiced his understanding and thanks.  Medication Samples have been provided to the patient.  Drug name: Eliquis 5mg Qty: 42LOT: [id number] Exp.Date: 3/21  The patient has been instructed regarding the correct time, dose, and frequency of taking this medication, including desired effects and most common side effects.

## 2017-06-01 ENCOUNTER — Ambulatory Visit (INDEPENDENT_AMBULATORY_CARE_PROVIDER_SITE_OTHER): Payer: Self-pay | Admitting: Cardiology

## 2017-06-01 ENCOUNTER — Encounter: Payer: Self-pay | Admitting: Cardiology

## 2017-06-01 VITALS — BP 142/100 | HR 86 | Ht 68.0 in | Wt 229.2 lb

## 2017-06-01 DIAGNOSIS — I5021 Acute systolic (congestive) heart failure: Secondary | ICD-10-CM

## 2017-06-01 DIAGNOSIS — I10 Essential (primary) hypertension: Secondary | ICD-10-CM

## 2017-06-01 DIAGNOSIS — I5031 Acute diastolic (congestive) heart failure: Secondary | ICD-10-CM

## 2017-06-01 DIAGNOSIS — I4892 Unspecified atrial flutter: Secondary | ICD-10-CM

## 2017-06-01 DIAGNOSIS — I428 Other cardiomyopathies: Secondary | ICD-10-CM | POA: Insufficient documentation

## 2017-06-01 DIAGNOSIS — I2510 Atherosclerotic heart disease of native coronary artery without angina pectoris: Secondary | ICD-10-CM

## 2017-06-01 DIAGNOSIS — Z7901 Long term (current) use of anticoagulants: Secondary | ICD-10-CM

## 2017-06-01 MED ORDER — FUROSEMIDE 40 MG PO TABS: 40.0000 mg | ORAL_TABLET | Freq: Every day | ORAL | 3 refills | Status: DC

## 2017-06-01 MED ORDER — LOSARTAN POTASSIUM 25 MG PO TABS: 25.0000 mg | ORAL_TABLET | Freq: Every day | ORAL | 3 refills | Status: DC

## 2017-06-01 NOTE — Progress Notes (Signed)
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 He presented in Nov 2018 with acute pulmonary edema in the setting of accelerated HTN (untreated) and atrial flutter (new). He had also used cocaine prior to admission. The pt was admitted to ICU and diuresed and treated with Diltiazem for rate control. He underwent TEE CV that admission and has been in NSR since. He was discharged on Lasix and Eliquis. He was not initially sent home on beta blocker secondary to cocaine use and not discharged on an ARB-? Secondary to renal insufficiency.   He saw Bjorn Loser 05/15/17 and Coreg was added. He is in the office today to see me in follow up. He says he was doing well until the last 48 hrs when he noted increasing wgt and dyspnea. He admits he has been drinking a lot of water and eating "healthy" frozen meals. He denies orthopnea or edema.    Current Outpatient Medications  Medication Sig Dispense Refill  . apixaban (ELIQUIS) 5 MG TABS tablet Take 1 tablet (5 mg total) by mouth 2 (two) times daily. 60 tablet 2  . carvedilol (COREG) 6.25 MG tablet Take 1 tablet (6.25 mg total) by mouth 2 (two) times daily. 180 tablet 3  . furosemide (LASIX) 40 MG tablet Take 1 tablet (40 mg total) by mouth daily. 90 tablet 3  . potassium chloride (K-DUR) 10 MEQ tablet Take 1 tablet (10 mEq total) by mouth daily. 90 tablet 3  . losartan (COZAAR) 25 MG tablet Take 1 tablet (25 mg total) by mouth daily. 90 tablet 3   No current facility-administered medications for this visit.     No Known Allergies  Past Medical History:  Diagnosis Date  . Acute systolic CHF (congestive heart failure) (HCC) 04/24/2017  . Atrial flutter, paroxysmal (HCC) 03/2017  . Chronic anticoagulation 03/2017   Eliquis  . Hypertension     Social History   Socioeconomic History  . Marital status: Married    Spouse  name: Not on file  . Number of children: Not on file  . Years of education: Not on file  . Highest education level: Not on file  Social Needs  . Financial resource strain: Not on file  . Food insecurity - worry: Not on file  . Food insecurity - inability: Not on file  . Transportation needs - medical: Not on file  . Transportation needs - non-medical: Not on file  Occupational History  . Not on file  Tobacco Use  . Smoking status: Former Smoker    Types: Cigarettes    Start date: 05/15/2005    Last attempt to quit: 04/16/2017    Years since quitting: 0.1  . Smokeless tobacco: Never Used  Substance and Sexual Activity  . Alcohol use: Not on file  . Drug use: Yes  . Sexual activity: Not on file  Other Topics Concern  . Not on file  Social History Narrative  . Not on file     Family History  Problem Relation Age of Onset  . Heart murmur Father   . Hypertension Father      Review of Systems: General: negative for chills, fever, night sweats or weight changes.  Cardiovascular: negative for chest pain, dyspnea on exertion, edema, orthopnea, palpitations, paroxysmal nocturnal dyspnea or shortness of breath Dermatological: negative for rash Respiratory: negative for cough or wheezing Urologic: negative  for hematuria Abdominal: negative for nausea, vomiting, diarrhea, bright red blood per rectum, melena, or hematemesis Neurologic: negative for visual changes, syncope, or dizziness All other systems reviewed and are otherwise negative except as noted above.    Blood pressure (!) 142/100, pulse 86, height 5\' 8"  (1.727 m), weight 229 lb 3.2 oz (104 kg).  General appearance: alert, cooperative, no distress, mildly obese and poor dentition Neck: elevated JVP sitting Lungs: few rales Heart: regular rate and rhythm Extremities: no edema Skin: warm and dry Neurologic: Grossly normal  EKG NSR, 86, LVH with repol changes  ASSESSMENT AND PLAN:   Acute systolic CHF (congestive  heart failure) (HCC) Pt complains of increasing DOE the last two days. His wgt is up  NICM (nonischemic cardiomyopathy) (HCC) EF 30-35% by echo 11.29/18  Hypertension Poor control  Coronary artery disease involving native coronary artery Moderate LAD disease by coronary CTA Nov 2018  Atrial flutter, paroxysmal (HCC) Pt presented in Nov with acute pulmonary edema and atrial flutter. He has held NSR since TEE CV Nov 2018  Chronic anticoagulation On Eliquis- CHADS VASC=3 for HTN, CHF and vascular disease   PLAN  Repeat B/P by me 132/92. I told him to increase his Lasix to 40 mg daily and added Cozaar 25 mg. He'll need to return to see me in 1-2 weeks. Increase medications then depending on his B/P and renal function (check BMP then). He'll need an echo in 3 months. We discussed the importance of good diet and healthy lifestyle.   Corine Shelter PA-C 06/01/2017 10:38 AM

## 2017-06-01 NOTE — Patient Instructions (Signed)
Medication Instructions:  INCREASE furosemide (Lasix) to 40 mg daily  START losartan (Cozaar) 25 mg daily   Follow-Up: Your physician recommends that you schedule a follow-up appointment in: Week of 1/14 with Corine Shelter PA   Any Other Special Instructions Will Be Listed Below (If Applicable).     If you need a refill on your cardiac medications before your next appointment, please call your pharmacy.

## 2017-06-01 NOTE — Assessment & Plan Note (Signed)
Pt complains of increasing DOE the last two days. His wgt is up

## 2017-06-01 NOTE — Assessment & Plan Note (Signed)
Moderate LAD disease by coronary CTA Nov 2018

## 2017-06-01 NOTE — Assessment & Plan Note (Signed)
Poor control

## 2017-06-01 NOTE — Assessment & Plan Note (Signed)
EF 30-35% by echo 11.29/18 

## 2017-06-01 NOTE — Assessment & Plan Note (Signed)
Pt presented in Nov with acute pulmonary edema and atrial flutter. He has held NSR since TEE CV Nov 2018

## 2017-06-01 NOTE — Assessment & Plan Note (Signed)
On Eliquis- CHADS VASC=3 for HTN, CHF and vascular disease

## 2017-06-15 ENCOUNTER — Ambulatory Visit (INDEPENDENT_AMBULATORY_CARE_PROVIDER_SITE_OTHER): Payer: Self-pay | Admitting: Cardiology

## 2017-06-15 ENCOUNTER — Encounter: Payer: Self-pay | Admitting: Cardiology

## 2017-06-15 VITALS — BP 152/88 | HR 94 | Ht 68.0 in | Wt 232.0 lb

## 2017-06-15 DIAGNOSIS — I5021 Acute systolic (congestive) heart failure: Secondary | ICD-10-CM

## 2017-06-15 DIAGNOSIS — I428 Other cardiomyopathies: Secondary | ICD-10-CM

## 2017-06-15 DIAGNOSIS — I4892 Unspecified atrial flutter: Secondary | ICD-10-CM

## 2017-06-15 DIAGNOSIS — I2510 Atherosclerotic heart disease of native coronary artery without angina pectoris: Secondary | ICD-10-CM

## 2017-06-15 DIAGNOSIS — I10 Essential (primary) hypertension: Secondary | ICD-10-CM

## 2017-06-15 DIAGNOSIS — Z7901 Long term (current) use of anticoagulants: Secondary | ICD-10-CM

## 2017-06-15 LAB — BASIC METABOLIC PANEL
BUN/Creatinine Ratio: 18 (ref 10–24)
BUN: 19 mg/dL (ref 8–27)
CO2: 23 mmol/L (ref 20–29)
Calcium: 9.4 mg/dL (ref 8.6–10.2)
Chloride: 105 mmol/L (ref 96–106)
Creatinine, Ser: 1.04 mg/dL (ref 0.76–1.27)
GFR calc Af Amer: 90 mL/min/{1.73_m2} (ref >59)
GFR calc non Af Amer: 78 mL/min/{1.73_m2} (ref >59)
Glucose: 95 mg/dL (ref 65–99)
Potassium: 4.7 mmol/L (ref 3.5–5.2)
Sodium: 141 mmol/L (ref 134–144)

## 2017-06-15 MED ORDER — METOLAZONE 2.5 MG PO TABS: 2.5000 mg | ORAL_TABLET | ORAL | 3 refills | Status: DC

## 2017-06-15 MED ORDER — LOSARTAN POTASSIUM 50 MG PO TABS: 50.0000 mg | ORAL_TABLET | Freq: Every day | ORAL | 6 refills | Status: DC

## 2017-06-15 NOTE — Progress Notes (Signed)
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 He had also used cocaine prior to admission. The pt was admitted to ICU and diuresed and treated with Diltiazem for rate control. He underwent TEE CV that admission and has been in NSR since. He was discharged on Lasix and Eliquis. He was not initially sent home on beta blocker secondary to cocaine use and not discharged on an ARB-? secondary to renal insufficiency. Echo in Nov showed an EF of 30-35% with mild LVH. He saw Bjorn Loser 05/15/17 and Coreg was added. I saw him 06/01/16. His wgt was up and he was complaining of DOE. I added Cozaar and increased his Lasix to 40 mg. He is in the office today for follow up. He thinks the Lasix helped "some" but he still has early DOE and his wgt continues to go up, 3 lbs from LOV, 24 lbs from his discharge wgt in Dec. He had orthopnea in the past but he says this is improved.    Current Outpatient Medications  Medication Sig Dispense Refill  . apixaban (ELIQUIS) 5 MG TABS tablet Take 1 tablet (5 mg total) by mouth 2 (two) times daily. 60 tablet 2  . carvedilol (COREG) 6.25 MG tablet Take 1 tablet (6.25 mg total) by mouth 2 (two) times daily. 180 tablet 3  . furosemide (LASIX) 40 MG tablet Take 1 tablet (40 mg total) by mouth daily. 90 tablet 3  . potassium chloride (K-DUR) 10 MEQ tablet Take 1 tablet (10 mEq total) by mouth daily. 90 tablet 3   No current facility-administered medications for this visit.     No Known Allergies  Past Medical History:  Diagnosis Date  . Acute systolic CHF (congestive heart failure) (HCC) 04/24/2017  . Atrial flutter, paroxysmal (HCC) 03/2017  . Chronic anticoagulation 03/2017   Eliquis  . Hypertension     Social History     Socioeconomic History  . Marital status: Married    Spouse name: Not on file  . Number of children: Not on file  . Years of education: Not on file  . Highest education level: Not on file  Social Needs  . Financial resource strain: Not on file  . Food insecurity - worry: Not on file  . Food insecurity - inability: Not on file  . Transportation needs - medical: Not on file  . Transportation needs - non-medical: Not on file  Occupational History  . Not on file  Tobacco Use  . Smoking status: Former Smoker    Types: Cigarettes    Start date: 05/15/2005    Last attempt to quit: 04/16/2017    Years since quitting: 0.1  . Smokeless tobacco: Never Used  Substance and Sexual Activity  . Alcohol use: Not on file  . Drug use: Yes  . Sexual activity: Not on file  Other Topics Concern  . Not on file  Social History Narrative  . Not on file     Family History  Problem Relation Age of Onset  . Heart murmur Father   . Hypertension Father      Review of Systems: General: negative for chills, fever, night sweats or weight changes.  Cardiovascular: negative for chest pain, dyspnea on exertion, edema, orthopnea, palpitations, paroxysmal nocturnal  dyspnea or shortness of breath Dermatological: negative for rash Respiratory: negative for cough or wheezing Urologic: negative for hematuria Abdominal: negative for nausea, vomiting, diarrhea, bright red blood per rectum, melena, or hematemesis Neurologic: negative for visual changes, syncope, or dizziness All other systems reviewed and are otherwise negative except as noted above.    Blood pressure (!) 152/88, pulse 94, height 5\' 8"  (1.727 m), weight 232 lb (105.2 kg).  General appearance: alert, cooperative, no distress, mildly obese and poor dentition Neck: no carotid bruit and JVD to angle when recumbent Lungs: basilar crackles Heart: regular rate and rhythm Extremities: no edema Skin: Skin color, texture, turgor normal. No  rashes or lesions Neurologic: Grossly normal   ASSESSMENT AND PLAN:   Acute systolic CHF (congestive heart failure) (HCC) Some improvement in his symptoms since his medications were adjusted but he still is volume overloaded on exam and his wgt continues to go up  NICM (nonischemic cardiomyopathy) (HCC) EF 30-35% by echo 11.29/18  Hypertension Poor control-increase Cozaar  Coronary artery disease involving native coronary artery Moderate LAD disease by coronary CTA Nov 2018  Atrial flutter, paroxysmal (HCC) Pt presented in Nov with acute pulmonary edema and atrial flutter. He has held NSR since TEE CV Nov 2018  Chronic anticoagulation On Eliquis- CHADS VASC=3 for HTN, CHF and vascular disease   PLAN  I increased his Cozaar to 50 mg and added Zaroxolyn 2.5 mg daily x 3 days, then twice a week- Tuesday and Friday. He'll need to return in two weeks. Check BMP and BNP today, check f/u echo in Feb once he is volume stable and on good medications. Corine Shelter PA-C 06/15/2017 10:57 AM

## 2017-06-15 NOTE — Patient Instructions (Addendum)
Medication Instructions:   Your physician has recommended you make the following change in your medication:   Increase losartan to 50 mg by mouth daily. You may take (2) of your 25 mg tablets daily until they are finished.  Start metolazone 2.5 mg by mouth 30 minutes before your furosemide today (06/15/17), tomorrow (06/16/17) & Sunday (06/17/17); then, starting next week on Tuesday, 06/19/17, take one tablet by mouth twice weekly on Tuesday and Friday only.   Please taken an extra furosemide 40 mg by mouth today only, 30 minutes after your metolazone 2.5 mg.  Continue all other medications the same.  Labwork:  Your physician recommends that you have lab work today to check your BMET & BNP.  Testing/Procedures:  NONE  Follow-Up:  Your physician recommends that you schedule a follow-up appointment in: 2 weeks with Corine Shelter.  Any Other Special Instructions Will Be Listed Below (If Applicable).  If you need a refill on your cardiac medications before your next appointment, please call your pharmacy.

## 2017-06-16 LAB — BRAIN NATRIURETIC PEPTIDE: BNP: 314.7 pg/mL — ABNORMAL HIGH (ref 0.0–100.0)

## 2017-06-19 ENCOUNTER — Telehealth: Payer: Self-pay | Admitting: Cardiology

## 2017-06-19 NOTE — Telephone Encounter (Signed)
Returned call to patient who saw [place], [place] last week. He complains of right hand swelling that started yesterday. He reports his hand is sore at his wrist, feels like fluid in his fingers. He has no obvious injury, open wounds, etc. He soaked his hand in epsom salt last night which helped some. Informed him that if he had swelling from cardiac causes, it would not be unilateral and explained that metolazone is a fluid pill or diuretic, so it should not cause swelling. Advised that he continue to monitor and/or seek eval from PCP or urgent care.   Will route to PA for any further recommendations

## 2017-06-19 NOTE — Telephone Encounter (Signed)
New MEssage  Pt c/o medication issue:  1. Name of Medication: Metolazone  2. How are you currently taking this medication (dosage and times per day)? 2.5mg    3. Are you having a reaction (difficulty breathing--STAT)? Right hand swollen  4. What is your medication issue? Please call back

## 2017-06-20 NOTE — Telephone Encounter (Signed)
agree

## 2017-06-22 ENCOUNTER — Telehealth: Payer: Self-pay | Admitting: Cardiology

## 2017-06-22 NOTE — Telephone Encounter (Signed)
°*  STAT* If patient is at the pharmacy, call can be transferred to refill team.   1. Which medications need to be refilled? (please list name of each medication and dose if known) need new prescription for Furosemide 40mg   2. Which pharmacy/location (including street and city if local pharmacy) is medication to be sent to?[phone]  3. Do they need a 30 day or 90 day supply? 90 and refills

## 2017-06-22 NOTE — Telephone Encounter (Signed)
Call and spoke with pt letting him know Rx for furosemide was send into pt pharmacy on 06/01/2017

## 2017-07-03 ENCOUNTER — Ambulatory Visit: Payer: Self-pay | Admitting: Physician Assistant

## 2017-07-03 ENCOUNTER — Telehealth: Payer: Self-pay

## 2017-07-03 NOTE — Telephone Encounter (Addendum)
Pt rescheduled appt from today with R. Barrett to 2/21 due to family emergency. Called to check on pt weight and breathing. Pt stated his breathing is "doing great" as he even is less winded when moving around.  He did state that he has not weighted himself since his last visit on 1/18 but he does not feel that it has changed much. We reviewed his medications and he has only been taking his Metolazone one daily, we reviewed that on Tuesdays and Fridays he needs to take it twice a weekly. He verbalized understanding.  Pt verbalized he is drinking a lot of water about a gallon a day and sometimes more.  He did say he has adjusted his diet eating a lot of salads and baked foods since last appt.

## 2017-07-03 NOTE — Progress Notes (Deleted)
Cardiology Office Note   Date:  07/03/2017   ID:  Roger Humphrey, DOB November 25, 1955, MRN [id number]  PCP:  Patient, No Pcp Per  Cardiologist: Dr. [name] 04/27/2017 in hospital Theodore Demark, PA-C 05/15/2017  No chief complaint on file.   History of Present Illness: Roger Humphrey is a 62 y.o. male with a history of HTN, cocaine use, diagnosed 03/2017 with NSTEMI (med Rx for multivessel CAD), atrial flutter s/p TEE/DCCV>>SR (on Eliquis) and acute systolic CHF  12/18 office visit, weight 226 pounds patient volume overloaded and started on Lasix plus Coreg 06/01/2017 office visit weight 229 pounds, Lasix increased to 40 mg daily and Cozaar 25 mg daily added 06/15/2017 office visit, weight 232 pounds, Cozaar increased to 50 mg and Zaroxolyn 2.5 mg daily for 3 days then on Tuesdays and Fridays added.  Check echo in February once he is volume stable 0 1/23 phone notes regarding metolazone and right hand swelling, the 2 are not felt related  Roger Humphrey presents for ***   Past Medical History:  Diagnosis Date  . Acute systolic CHF (congestive heart failure) (HCC) 04/24/2017  . Atrial flutter, paroxysmal (HCC) 03/2017  . Chronic anticoagulation 03/2017   Eliquis  . Hypertension     Past Surgical History:  Procedure Laterality Date  . FACIAL LACERATION REPAIR  05/2010    Current Outpatient Medications  Medication Sig Dispense Refill  . apixaban (ELIQUIS) 5 MG TABS tablet Take 1 tablet (5 mg total) by mouth 2 (two) times daily. 60 tablet 2  . carvedilol (COREG) 6.25 MG tablet Take 1 tablet (6.25 mg total) by mouth 2 (two) times daily. 180 tablet 3  . furosemide (LASIX) 40 MG tablet Take 1 tablet (40 mg total) by mouth daily. 90 tablet 3  . losartan (COZAAR) 50 MG tablet Take 1 tablet (50 mg total) by mouth daily. 30 tablet 6  . metolazone (ZAROXOLYN) 2.5 MG tablet Take 1 tablet (2.5 mg total) by mouth as directed. Take one tablet on 06/15/17, 06/16/17 and 06/17/17--30  minutes before your furosemide; then, starting next week on 06/19/17 take one tablet twice weekly on Tuesday and Friday only. 12 tablet 3  . potassium chloride (K-DUR) 10 MEQ tablet Take 1 tablet (10 mEq total) by mouth daily. 90 tablet 3   No current facility-administered medications for this visit.     Allergies:   Patient has no known allergies.    Social History:  The patient  reports that he quit smoking about 2 months ago. His smoking use included cigarettes. He started smoking about 12 years ago. he has never used smokeless tobacco. He reports that he uses drugs.   Family History:  The patient's family history includes Heart murmur in his father; Hypertension in his father.    ROS:  Please see the history of present illness. All other systems are reviewed and negative.    PHYSICAL EXAM: VS:  There were no vitals taken for this visit. , BMI There is no height or weight on file to calculate BMI. GEN: Well nourished, well developed, male in no acute distress  HEENT: normal for age  Neck: no JVD, no carotid bruit, no masses Cardiac: RRR; no murmur, no rubs, or gallops Respiratory:  clear to auscultation bilaterally, normal work of breathing GI: soft, nontender, nondistended, + BS MS: no deformity or atrophy; no edema; distal pulses are 2+ in all 4 extremities   Skin: warm and dry, no rash Neuro:  Strength and sensation are intact Psych: euthymic  mood, full affect   EKG:  EKG {ACTION; IS/IS [id number]:[id number]} ordered today. The ekg ordered today demonstrates ***   Recent Labs: 04/24/2017: TSH 0.564 04/26/2017: ALT 23 04/28/2017: Magnesium 2.2 04/29/2017: Hemoglobin 12.1; Platelets 463 06/15/2017: BNP 314.7; BUN 19; Creatinine, Ser 1.04; Potassium 4.7; Sodium 141    Lipid Panel No results found for: CHOL, TRIG, HDL, CHOLHDL, VLDL, LDLCALC, LDLDIRECT   Wt Readings from Last 3 Encounters:  06/15/17 232 lb (105.2 kg)  06/01/17 229 lb 3.2 oz (104 kg)  05/15/17 226 lb (102.5 kg)       Other studies Reviewed: Additional studies/ records that were reviewed today include: ***.  ASSESSMENT AND PLAN:  1.  ***   Current medicines are reviewed at length with the patient today.  The patient {ACTIONS; HAS/DOES NOT HAVE:19233} concerns regarding medicines.  The following changes have been made:  {PLAN; NO CHANGE:13088:s}  Labs/ tests ordered today include: *** No orders of the defined types were placed in this encounter.    Disposition:   FU with Dr. [name]  Signed, Theodore Demark, PA-C  07/03/2017 7:38 AM    [facility] Phone: [phone]; Fax: [phone]  This note was written with the assistance of speech recognition software. Please excuse any transcriptional errors.

## 2017-07-19 ENCOUNTER — Ambulatory Visit: Payer: Self-pay | Admitting: Physician Assistant

## 2017-07-19 NOTE — Progress Notes (Deleted)
Cardiology Office Note   Date:  07/19/2017   ID:  Roger Humphrey, DOB 06-16-1955, MRN [id number]  PCP:  Patient, No Pcp Per  Cardiologist: Dr. [name], in hospital 04/27/2017 Theodore Demark, PA-C 05/15/2017  No chief complaint on file.   History of Present Illness: Roger Humphrey is a 62 y.o. male with a history of  HTN, cocaine use, tob use. 03/2017 NSTEMI (med Rx for multivessel CAD), atrial flutter s/p TEE/DCCV>>SR (on Eliquis) and acute systolic CHF  05/15/2017 office visit, weight 226 pounds patient volume overloaded and started on Lasix plus Coreg 06/01/2017 office visit weight 229 pounds, Lasix increased to 40 mg daily and Cozaar 25 mg daily added 06/15/2017 office visit, weight 232 pounds, Cozaar increased to 50 mg and Zaroxolyn 2.5 mg daily for 3 days then on Tuesdays and Fridays added.  Check echo in February once he is volume stable 01/23 phone notes regarding metolazone and right hand swelling, the 2 are not felt related   Roger Humphrey presents for ***   Past Medical History:  Diagnosis Date  . Acute systolic CHF (congestive heart failure) (HCC) 04/24/2017  . Atrial flutter, paroxysmal (HCC) 03/2017  . Chronic anticoagulation 03/2017   Eliquis  . Hypertension     Past Surgical History:  Procedure Laterality Date  . FACIAL LACERATION REPAIR  05/2010    Current Outpatient Medications  Medication Sig Dispense Refill  . apixaban (ELIQUIS) 5 MG TABS tablet Take 1 tablet (5 mg total) by mouth 2 (two) times daily. 60 tablet 2  . carvedilol (COREG) 6.25 MG tablet Take 1 tablet (6.25 mg total) by mouth 2 (two) times daily. 180 tablet 3  . furosemide (LASIX) 40 MG tablet Take 1 tablet (40 mg total) by mouth daily. 90 tablet 3  . losartan (COZAAR) 50 MG tablet Take 1 tablet (50 mg total) by mouth daily. 30 tablet 6  . metolazone (ZAROXOLYN) 2.5 MG tablet Take 1 tablet (2.5 mg total) by mouth as directed. Take one tablet on 06/15/17, 06/16/17 and 06/17/17--30  minutes before your furosemide; then, starting next week on 06/19/17 take one tablet twice weekly on Tuesday and Friday only. 12 tablet 3  . potassium chloride (K-DUR) 10 MEQ tablet Take 1 tablet (10 mEq total) by mouth daily. 90 tablet 3   No current facility-administered medications for this visit.     Allergies:   Patient has no known allergies.    Social History:  The patient  reports that he quit smoking about 3 months ago. His smoking use included cigarettes. He started smoking about 12 years ago. he has never used smokeless tobacco. He reports that he uses drugs.   Family History:  The patient's family history includes Heart murmur in his father; Hypertension in his father.    ROS:  Please see the history of present illness. All other systems are reviewed and negative.    PHYSICAL EXAM: VS:  There were no vitals taken for this visit. , BMI There is no height or weight on file to calculate BMI. GEN: Well nourished, well developed, male in no acute distress  HEENT: normal for age  Neck: no JVD, no carotid bruit, no masses Cardiac: RRR; no murmur, no rubs, or gallops Respiratory:  clear to auscultation bilaterally, normal work of breathing GI: soft, nontender, nondistended, + BS MS: no deformity or atrophy; no edema; distal pulses are 2+ in all 4 extremities   Skin: warm and dry, no rash Neuro:  Strength and sensation are intact Psych:  euthymic mood, full affect   EKG:  EKG {ACTION; IS/IS [id number]:[id number]} ordered today. The ekg ordered today demonstrates ***   Recent Labs: 04/24/2017: TSH 0.564 04/26/2017: ALT 23 04/28/2017: Magnesium 2.2 04/29/2017: Hemoglobin 12.1; Platelets 463 06/15/2017: BNP 314.7; BUN 19; Creatinine, Ser 1.04; Potassium 4.7; Sodium 141    Lipid Panel No results found for: CHOL, TRIG, HDL, CHOLHDL, VLDL, LDLCALC, LDLDIRECT   Wt Readings from Last 3 Encounters:  06/15/17 232 lb (105.2 kg)  06/01/17 229 lb 3.2 oz (104 kg)  05/15/17 226 lb (102.5 kg)       Other studies Reviewed: Additional studies/ records that were reviewed today include: ***.  ASSESSMENT AND PLAN:  1.  ***   Current medicines are reviewed at length with the patient today.  The patient {ACTIONS; HAS/DOES NOT HAVE:19233} concerns regarding medicines.  The following changes have been made:  {PLAN; NO CHANGE:13088:s}  Labs/ tests ordered today include: *** No orders of the defined types were placed in this encounter.    Disposition:   FU with Dr. [name]  Signed, Theodore Demark, PA-C  07/19/2017 8:00 AM    [facility] Phone: [phone]; Fax: [phone]  This note was written with the assistance of speech recognition software. Please excuse any transcriptional errors.

## 2017-08-14 ENCOUNTER — Telehealth: Payer: Self-pay | Admitting: Cardiovascular Disease

## 2017-08-14 NOTE — Telephone Encounter (Signed)
Mr. Bourgoin is calling to find out if he needs to continue to take the Eliquis .  Just took the last one this morning . Please Call    Thanks

## 2017-08-14 NOTE — Telephone Encounter (Signed)
Follow up    Patient got disconnected , he was checking to see if you had samples   Patient calling the office for samples of medication:   1.  What medication and dosage are you requesting samples for? Eliquis   2.  Are you currently out of this medication?  yes

## 2017-08-14 NOTE — Telephone Encounter (Signed)
Returned call to patient of Dr. C who reports he has no insurance coverage and cannot afford eliquis. He took his last pill this AM.   Medication samples have been provided to the patient.  Drug name: eliquis 5mg   Qty: 5 boxes  LOT: [id number]  Exp.Date: 10/2019  Samples left at front desk for patient pick-up. Patient notified, aware he will need to complete patient assistance application + get print out from pharmacy with out of pocket expenses on Rx(s) this year and bring back to office to be submitted  Lindell Spar 11:48 AM 08/14/2017

## 2017-08-20 ENCOUNTER — Telehealth: Payer: Self-pay | Admitting: Physician Assistant

## 2017-08-20 NOTE — Telephone Encounter (Signed)
Patient has been made aware that samples are at the front desk.

## 2017-08-20 NOTE — Telephone Encounter (Signed)
New Message:    Pt is calling about a request he had for assistance with his apixaban (ELIQUIS) 5 MG TABS tablet. Pt also states that some samples was supposed to be left for him at the front but when he came to pick them up they were not there.

## 2017-08-23 ENCOUNTER — Ambulatory Visit: Payer: Self-pay | Admitting: Physician Assistant

## 2017-08-23 NOTE — Progress Notes (Deleted)
Cardiology Office Note   Date:  08/23/2017   ID:  Roger Humphrey, DOB 08/23/1955, MRN [id number]  PCP:  Patient, No Pcp Per  Cardiologist: Dr. [name], 04/27/2017 in hospital [facility] 06/01/2017 Theodore Demark, PA-C 05/15/2017  No chief complaint on file.   History of Present Illness: Roger Humphrey is a 62 y.o. male with a history of HTN, cocaine use, non-STEMI 03/2017 with S-CHF (EF 30-35%), med rx for multi-vessel CAD, A flutter>>Eliquis>>TEE/DCCV>>SR, CHADS VASC=3 for HTN, CHF and vascular disease  06/01/2017 office visit, Lasix increased to 40 mg daily, sodium and dietary compliance encouraged Phone notes since then regarding problems affording Eliquis, still with dietary and sodium compliance issues breathing okay  Roger Humphrey presents for ***   Past Medical History:  Diagnosis Date  . Acute systolic CHF (congestive heart failure) (HCC) 04/24/2017  . Atrial flutter, paroxysmal (HCC) 03/2017  . Chronic anticoagulation 03/2017   Eliquis  . Hypertension     Past Surgical History:  Procedure Laterality Date  . FACIAL LACERATION REPAIR  05/2010    Current Outpatient Medications  Medication Sig Dispense Refill  . apixaban (ELIQUIS) 5 MG TABS tablet Take 1 tablet (5 mg total) by mouth 2 (two) times daily. 60 tablet 2  . carvedilol (COREG) 6.25 MG tablet Take 1 tablet (6.25 mg total) by mouth 2 (two) times daily. 180 tablet 3  . furosemide (LASIX) 40 MG tablet Take 1 tablet (40 mg total) by mouth daily. 90 tablet 3  . losartan (COZAAR) 50 MG tablet Take 1 tablet (50 mg total) by mouth daily. 30 tablet 6  . metolazone (ZAROXOLYN) 2.5 MG tablet Take 1 tablet (2.5 mg total) by mouth as directed. Take one tablet on 06/15/17, 06/16/17 and 06/17/17--30 minutes before your furosemide; then, starting next week on 06/19/17 take one tablet twice weekly on Tuesday and Friday only. 12 tablet 3  . potassium chloride (K-DUR) 10 MEQ tablet Take 1 tablet (10 mEq total) by mouth  daily. 90 tablet 3   No current facility-administered medications for this visit.     Allergies:   Patient has no known allergies.    Social History:  The patient  reports that he quit smoking about 4 months ago. His smoking use included cigarettes. He started smoking about 12 years ago. He has never used smokeless tobacco. He reports that he has current or past drug history.   Family History:  The patient's family history includes Heart murmur in his father; Hypertension in his father.    ROS:  Please see the history of present illness. All other systems are reviewed and negative.    PHYSICAL EXAM: VS:  There were no vitals taken for this visit. , BMI There is no height or weight on file to calculate BMI. GEN: Well nourished, well developed, male in no acute distress  HEENT: normal for age  Neck: no JVD, no carotid bruit, no masses Cardiac: RRR; no murmur, no rubs, or gallops Respiratory:  clear to auscultation bilaterally, normal work of breathing GI: soft, nontender, nondistended, + BS MS: no deformity or atrophy; no edema; distal pulses are 2+ in all 4 extremities   Skin: warm and dry, no rash Neuro:  Strength and sensation are intact Psych: euthymic mood, full affect   EKG:  EKG {ACTION; IS/IS [id number]:[id number]} ordered today. The ekg ordered today demonstrates ***   Recent Labs: 04/24/2017: TSH 0.564 04/26/2017: ALT 23 04/28/2017: Magnesium 2.2 04/29/2017: Hemoglobin 12.1; Platelets 463 06/15/2017: BNP 314.7; BUN 19; Creatinine, Ser  1.04; Potassium 4.7; Sodium 141    Lipid Panel No results found for: CHOL, TRIG, HDL, CHOLHDL, VLDL, LDLCALC, LDLDIRECT   Wt Readings from Last 3 Encounters:  06/15/17 232 lb (105.2 kg)  06/01/17 229 lb 3.2 oz (104 kg)  05/15/17 226 lb (102.5 kg)     Other studies Reviewed: Additional studies/ records that were reviewed today include: ***.  ASSESSMENT AND PLAN:  1.  ***   Current medicines are reviewed at length with the patient  today.  The patient {ACTIONS; HAS/DOES NOT HAVE:19233} concerns regarding medicines.  The following changes have been made:  {PLAN; NO CHANGE:13088:s}  Labs/ tests ordered today include: *** No orders of the defined types were placed in this encounter.    Disposition:   FU with Dr. [name]  Signed, Theodore Demark, PA-C  08/23/2017 8:07 AM    [facility] Phone: [phone]; Fax: [phone]  This note was written with the assistance of speech recognition software. Please excuse any transcriptional errors.

## 2017-09-02 ENCOUNTER — Other Ambulatory Visit: Payer: Self-pay | Admitting: Physician Assistant

## 2017-10-23 ENCOUNTER — Telehealth: Payer: Self-pay | Admitting: Cardiovascular Disease

## 2017-10-23 NOTE — Telephone Encounter (Signed)
Patient calling the office for samples of medication:   1.  What medication and dosage are you requesting samples for? Xarelto  2.  Are you currently out of this medication?  1 left and he takes 2 a day

## 2017-10-23 NOTE — Telephone Encounter (Signed)
Patient made aware that samples are available at the front. He takes Eliquis 5 mg and not Xarelto:  Medication Samples have been provided to the patient.  Drug name: Eliquis       Strength: 5 mg        Qty: 2 boxes  LOT: [id number]  Exp.Date: 6/21

## 2017-10-30 ENCOUNTER — Telehealth: Payer: Self-pay | Admitting: Cardiology

## 2017-10-30 NOTE — Telephone Encounter (Signed)
New Message:       Pt is stating that he is having severe stomach pain after taking his medication. Pt states is stays around for about 2 hrs and then goes away but comes back.

## 2017-10-30 NOTE — Telephone Encounter (Addendum)
Spoke with pt who states he has started developing stomach pain 30 min after he take his mediation that last about 2 hrs. He reports that it happens more so at night when he take his blood pressure medication along with the Eliquis. He does state he eat prior to taking his medication and denies taking any NSAIDS. Routing to [employer]. PA for recommendation.

## 2017-11-02 NOTE — Telephone Encounter (Signed)
Left message to call back  

## 2017-11-02 NOTE — Telephone Encounter (Signed)
He was supposed to return to have a follow up in Jan. He needs to come into the office, please arrange for a visit with an APP at [facility]. For now he should take OTC Zantac or Prilosec and continue his other medications.  Corine Shelter PA-C 11/02/2017 8:38 AM

## 2017-11-06 NOTE — Telephone Encounter (Signed)
Spoke with pt and advised of Corine Shelter, [place] recommendation. Pt verbalized understanding. Pt offered and earlier appointment to see APP. Pt declined and opted to keep appointment on 12/11/17. Pt also requesting for Eliquis samples. Informed that unfortunately there are none available at this time and to call back in a week.

## 2017-11-12 ENCOUNTER — Telehealth: Payer: Self-pay | Admitting: Cardiology

## 2017-11-12 NOTE — Telephone Encounter (Signed)
New message    Patient calling the office for samples of medication:   1.  What medication and dosage are you requesting samples for? Eliquis  2.  Are you currently out of this medication? yes   

## 2017-11-12 NOTE — Telephone Encounter (Signed)
I had asked our clinical pharmacists to see if there is any way we can provide assistance with the Eliquis a few months ago.  I am not sure what the answer was to that question. He has an appointment to see Franky Macho next month. Can discuss RTW at that time. I have not seen him in last 6 months MCr

## 2017-11-12 NOTE — Telephone Encounter (Signed)
Spoke to patient . Informed patient no samples are available for today. patient states he is unable to afford  Medication cost $400. No insurance. Patient states he has not had medication since Friday of last week. Patient states he has not been release to go back to work.   Patient aware will need to defer to  Doctor and/or exteneder.

## 2017-11-13 NOTE — Telephone Encounter (Signed)
Samples were provided on 10/23/17. Have patient complete patient assistance paperwork during next office visit.

## 2017-11-14 MED ORDER — APIXABAN 5 MG PO TABS: 5.0000 mg | ORAL_TABLET | Freq: Two times a day (BID) | ORAL | 0 refills | Status: DC

## 2017-11-14 NOTE — Addendum Note (Signed)
Addended by: Tobin Chad on: 11/14/2017 01:47 PM   Modules accepted: Orders

## 2017-11-14 NOTE — Telephone Encounter (Addendum)
Spoke to patient . Informed him samples are available for pick up. There is also a patient assistance form for patient to fill out and bring back. Patient is aware.

## 2017-11-23 MED ORDER — APIXABAN 5 MG PO TABS: 5.0000 mg | ORAL_TABLET | Freq: Two times a day (BID) | ORAL | 3 refills | Status: DC

## 2017-12-11 ENCOUNTER — Ambulatory Visit (INDEPENDENT_AMBULATORY_CARE_PROVIDER_SITE_OTHER): Payer: Self-pay | Admitting: Cardiology

## 2017-12-11 ENCOUNTER — Encounter: Payer: Self-pay | Admitting: Cardiology

## 2017-12-11 VITALS — BP 140/76 | HR 65 | Ht 68.0 in | Wt 220.6 lb

## 2017-12-11 DIAGNOSIS — Z7901 Long term (current) use of anticoagulants: Secondary | ICD-10-CM

## 2017-12-11 DIAGNOSIS — I428 Other cardiomyopathies: Secondary | ICD-10-CM

## 2017-12-11 DIAGNOSIS — I5042 Chronic combined systolic (congestive) and diastolic (congestive) heart failure: Secondary | ICD-10-CM

## 2017-12-11 DIAGNOSIS — Z91199 Patient's noncompliance with other medical treatment and regimen due to unspecified reason: Secondary | ICD-10-CM

## 2017-12-11 DIAGNOSIS — I2510 Atherosclerotic heart disease of native coronary artery without angina pectoris: Secondary | ICD-10-CM

## 2017-12-11 DIAGNOSIS — I4892 Unspecified atrial flutter: Secondary | ICD-10-CM

## 2017-12-11 DIAGNOSIS — Z9119 Patient's noncompliance with other medical treatment and regimen: Secondary | ICD-10-CM

## 2017-12-11 DIAGNOSIS — I10 Essential (primary) hypertension: Secondary | ICD-10-CM

## 2017-12-11 NOTE — Assessment & Plan Note (Signed)
Eliquis-CHADS VASC=3 for HTN, CHF and vascular disease

## 2017-12-11 NOTE — Assessment & Plan Note (Signed)
EF 30-35% by echo 11.29/18

## 2017-12-11 NOTE — Assessment & Plan Note (Signed)
Controlled.  

## 2017-12-11 NOTE — Assessment & Plan Note (Signed)
Currently stable- check labs

## 2017-12-11 NOTE — Patient Instructions (Signed)
Medication Instructions:  Your physician recommends that you continue on your current medications as directed. Please refer to the Current Medication list given to you today.  Labwork: Today (BMET)  Testing/Procedures: Your physician has requested that you have an echocardiogram. Echocardiography is a painless test that uses sound waves to create images of your heart. It provides your doctor with information about the size and shape of your heart and how well your heart's chambers and valves are working. This procedure takes approximately one hour. There are no restrictions for this procedure.  This will be done at our [facility] location:  [address]  Follow-Up: 3 months with Dr. Royann Humphrey  Any Other Special Instructions Will Be Listed Below (If Applicable).     If you need a refill on your cardiac medications before your next appointment, please call your pharmacy.

## 2017-12-11 NOTE — Assessment & Plan Note (Signed)
Pt presented in Nov with acute pulmonary edema and atrial flutter. He has held NSR since TEE CV Nov 2018 

## 2017-12-11 NOTE — Progress Notes (Signed)
12/11/2017 Roger Humphrey   [birth year]  [id number]  Primary Physician Patient, No Pcp Per Primary Cardiologist: Dr [name]  HPI:  62 y/o AA ,male, presented in Nov 2018 with acute pulmonary edema in the setting of accelerated HTN (untreated) and atrial flutter (new). 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 He underwent TEE CV that admission and has been in NSR since. He was discharged on Lasix and Eliquis. He was not initially sent home on beta blocker secondary to cocaine use and not discharged on an ARB-? secondary to renal insufficiency. Echo in Nov showed an EF of 30-35% with mild LVH. In Dec 2018 Coreg was added.  In Jan 2019 Cozaar was added and Lasix increased to 40 mg secondary to LE edema and orthopnea. He was to have a f/u echo in Feb 2019 but this was never done.  He presented to the office today for follow up. 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 He is helping care for his roommate who is in his 61's and has had an MI. His wife has been helping him get his medications and we have been providing him samples of Eliquis. He says he has filled out medication assistance paperwork. He denies orthopnea or tachycardia. He says he is following a low sodium diet.     Current Outpatient Medications  Medication Sig Dispense Refill  . apixaban (ELIQUIS) 5 MG TABS tablet Take 1 tablet (5 mg total) by mouth 2 (two) times daily. 180 tablet 3  . carvedilol (COREG) 6.25 MG tablet TAKE 1 TABLET BY MOUTH TWICE A DAY 180 tablet 0  . furosemide (LASIX) 40 MG tablet Take 1 tablet (40 mg total) by mouth daily. 90 tablet 3  . losartan (COZAAR) 50 MG tablet Take 1 tablet (50 mg total) by mouth daily. 30 tablet 6  . metolazone (ZAROXOLYN) 2.5 MG tablet Take 1 tablet (2.5 mg total) by mouth as directed. Take one tablet on 06/15/17, 06/16/17 and 06/17/17--30 minutes before your furosemide; then, starting next week on 06/19/17 take one tablet twice weekly on Tuesday  and Friday only. 12 tablet 3  . potassium chloride (K-DUR) 10 MEQ tablet Take 1 tablet (10 mEq total) by mouth daily. 90 tablet 3   No current facility-administered medications for this visit.     No Known Allergies  Past Medical History:  Diagnosis Date  . Acute systolic CHF (congestive heart failure) (HCC) 04/24/2017  . Atrial flutter, paroxysmal (HCC) 03/2017  . Chronic anticoagulation 03/2017   Eliquis  . Hypertension     Social History   Socioeconomic History  . Marital status: Married    Spouse name: Not on file  . Number of children: Not on file  . Years of education: Not on file  . Highest education level: Not on file  Occupational History  . Not on file  Social Needs  . Financial resource strain: Not on file  . Food insecurity:    Worry: Not on file    Inability: Not on file  . Transportation needs:    Medical: Not on file    Non-medical: Not on file  Tobacco Use  . Smoking status: Former Smoker    Types: Cigarettes    Start date: 05/15/2005    Last attempt to quit: 04/16/2017    Years since quitting: 0.6  . Smokeless tobacco: Never Used  Substance and Sexual Activity  . Alcohol use: Not on file  . Drug use: Yes  .  Sexual activity: Not on file  Lifestyle  . Physical activity:    Days per week: Not on file    Minutes per session: Not on file  . Stress: Not on file  Relationships  . Social connections:    Talks on phone: Not on file    Gets together: Not on file    Attends religious service: Not on file    Active member of club or organization: Not on file    Attends meetings of clubs or organizations: Not on file    Relationship status: Not on file  . Intimate partner violence:    Fear of current or ex partner: Not on file    Emotionally abused: Not on file    Physically abused: Not on file    Forced sexual activity: Not on file  Other Topics Concern  . Not on file  Social History Narrative  . Not on file     Family History  Problem  Relation Age of Onset  . Heart murmur Father   . Hypertension Father      Review of Systems: General: negative for chills, fever, night sweats or weight changes.  Cardiovascular: negative for chest pain, dyspnea on exertion, edema, orthopnea, palpitations, paroxysmal nocturnal dyspnea or shortness of breath Dermatological: negative for rash Respiratory: negative for cough or wheezing Urologic: negative for hematuria Abdominal: negative for nausea, vomiting, diarrhea, bright red blood per rectum, melena, or hematemesis Neurologic: negative for visual changes, syncope, or dizziness All other systems reviewed and are otherwise negative except as noted above.    Blood pressure 140/76, pulse 65, height 5\' 8"  (1.727 m), weight 220 lb 9.9 oz (100.1 kg), SpO2 99 %.  General appearance: alert, cooperative, no distress and poor dentition Neck: JVD noted Lungs: clear to auscultation bilaterally Heart: regular rate and rhythm and soft systolic murmur LSB Extremities: no edema Skin: Skin color, texture, turgor normal. No rashes or lesions Neurologic: Grossly normal  EKG [id number]-63  ASSESSMENT AND PLAN:   Chronic combined systolic and diastolic CHF (congestive heart failure) (HCC) Currently stable- check labs  Atrial flutter, paroxysmal (HCC) Pt presented in Nov with acute pulmonary edema and atrial flutter. He has held NSR since TEE CV Nov 2018  Coronary artery disease involving native coronary artery Moderate LAD disease by coronary CTA Nov 2018  Chronic anticoagulation Eliquis-CHADS VASC=3 for HTN, CHF and vascular disease  Hypertension Controlled  NICM (nonischemic cardiomyopathy) (HCC) EF 30-35% by echo 11.29/18   PLAN  Check BMP, check echo. F/U Dr [name] in 3 months  Corine Shelter PA-C 12/11/2017 10:45 AM

## 2017-12-11 NOTE — Assessment & Plan Note (Signed)
Moderate LAD disease by coronary CTA Nov 2018 

## 2017-12-14 ENCOUNTER — Ambulatory Visit (HOSPITAL_COMMUNITY): Payer: Self-pay

## 2017-12-14 ENCOUNTER — Telehealth: Payer: Self-pay | Admitting: *Deleted

## 2017-12-14 NOTE — Telephone Encounter (Signed)
Samples Eliquis 5 mg #3 lot #[id number] exp 6/21 at front desk for pick up, patient aware

## 2017-12-24 ENCOUNTER — Other Ambulatory Visit (HOSPITAL_COMMUNITY): Payer: Self-pay

## 2017-12-27 ENCOUNTER — Other Ambulatory Visit: Payer: Self-pay | Admitting: Physician Assistant

## 2017-12-27 NOTE — Telephone Encounter (Signed)
This is Dr. Croitoru's pt 

## 2018-01-10 ENCOUNTER — Encounter (HOSPITAL_COMMUNITY): Payer: Self-pay | Admitting: Radiology

## 2018-01-22 ENCOUNTER — Telehealth: Payer: Self-pay | Admitting: Cardiovascular Disease

## 2018-01-22 NOTE — Telephone Encounter (Signed)
Spoke with pt. Pt called in to request samples of Eliquis. Pt sts that he has 2 days of Eliquis left.  Adv pt that we are currently out of samples of Eliquis 5mg .  Asked pt if he has applied for pt assistance. Pt sts that he has in the past but never heard back. Pt sts that he is not interested in applying again.   Pt sts that the monthly cost is $436 and he cannot afford that. Recommended the pt purchase a minimum amount from his pharmacy and to call our office tomorrow to see if our samples have been restocked.  Pt is currently uninsured and is concerned about cost. Adv pt of his risk with anticoag interruption. Adv pt that he may need to consider switching to a more cost effective medication like Coumadin.   Pt sts that he will try to purchase a couple of days supply and call our office tomorrow.

## 2018-01-22 NOTE — Telephone Encounter (Signed)
Patient has been approved for assistance through [employer] as of 12/25/17. Called [employer] to confirm today. Company representative stated that their medication dispensing partner, Lilia Argue, makes 3 attempts to reach patient before holding the patient account. Rep reported that patient is still eligible to receive his medication as he has been approved to receive assistance.   Patient will need to contact [employer], [phone], to initiate medication shipment since he will be a new patient. Medication refills will then dispense automatically after that.  Attempted to contact patient x2. lmtcb

## 2018-01-22 NOTE — Telephone Encounter (Signed)
I would encourage him again to apply for assistance. Either way, may benefit from a visit with our Clinical Pharmacist team to see if he needs warfarin or another solution. MCr

## 2018-01-22 NOTE — Telephone Encounter (Signed)
New Message ° ° °Patient calling the office for samples of medication: ° ° °1.  What medication and dosage are you requesting samples for?apixaban (ELIQUIS) 5 MG TABS tablet  ° °2.  Are you currently out of this medication? Yes  ° ° ° °

## 2018-01-23 NOTE — Telephone Encounter (Signed)
Called patient. Notified him that he'd been approved for assistance. Advised him to call [employer] to activate his account to begin getting his medication. Patient verbalized understanding and agreed with plan.

## 2018-01-25 ENCOUNTER — Other Ambulatory Visit: Payer: Self-pay | Admitting: Cardiology

## 2018-02-01 ENCOUNTER — Other Ambulatory Visit: Payer: Self-pay

## 2018-02-01 MED ORDER — LOSARTAN POTASSIUM 50 MG PO TABS: 50.0000 mg | ORAL_TABLET | Freq: Every day | ORAL | 3 refills | Status: DC

## 2018-02-27 ENCOUNTER — Other Ambulatory Visit: Payer: Self-pay | Admitting: Cardiology

## 2018-06-12 ENCOUNTER — Other Ambulatory Visit: Payer: Self-pay | Admitting: Cardiology

## 2018-06-12 NOTE — Telephone Encounter (Signed)
Rx request sent to pharmacy.  

## 2018-06-18 NOTE — Telephone Encounter (Signed)
This encounter was created in error - please disregard.

## 2018-09-25 ENCOUNTER — Other Ambulatory Visit: Payer: Self-pay

## 2018-09-25 MED ORDER — FUROSEMIDE 40 MG PO TABS: 40.0000 mg | ORAL_TABLET | Freq: Every day | ORAL | 1 refills | Status: DC

## 2018-11-12 ENCOUNTER — Telehealth: Payer: Self-pay | Admitting: *Deleted

## 2018-11-12 NOTE — Telephone Encounter (Signed)
The application for assistance for Eliquis has been faxed to [facility]. Patient made aware.

## 2018-11-28 NOTE — Telephone Encounter (Signed)
Eliquis assistance has been approved from 11/13/2018-11/13/2019.

## 2018-12-16 ENCOUNTER — Other Ambulatory Visit: Payer: Self-pay | Admitting: Cardiology

## 2018-12-29 IMAGING — DX DG CHEST 1V PORT
1 series · 1 of 1 positions shown · non-contrast
Comparison: Chest x-ray dated June 11, 2010.

CLINICAL DATA: Severe shortness of breath.

EXAM:
PORTABLE CHEST 1 VIEW

[chest ap]
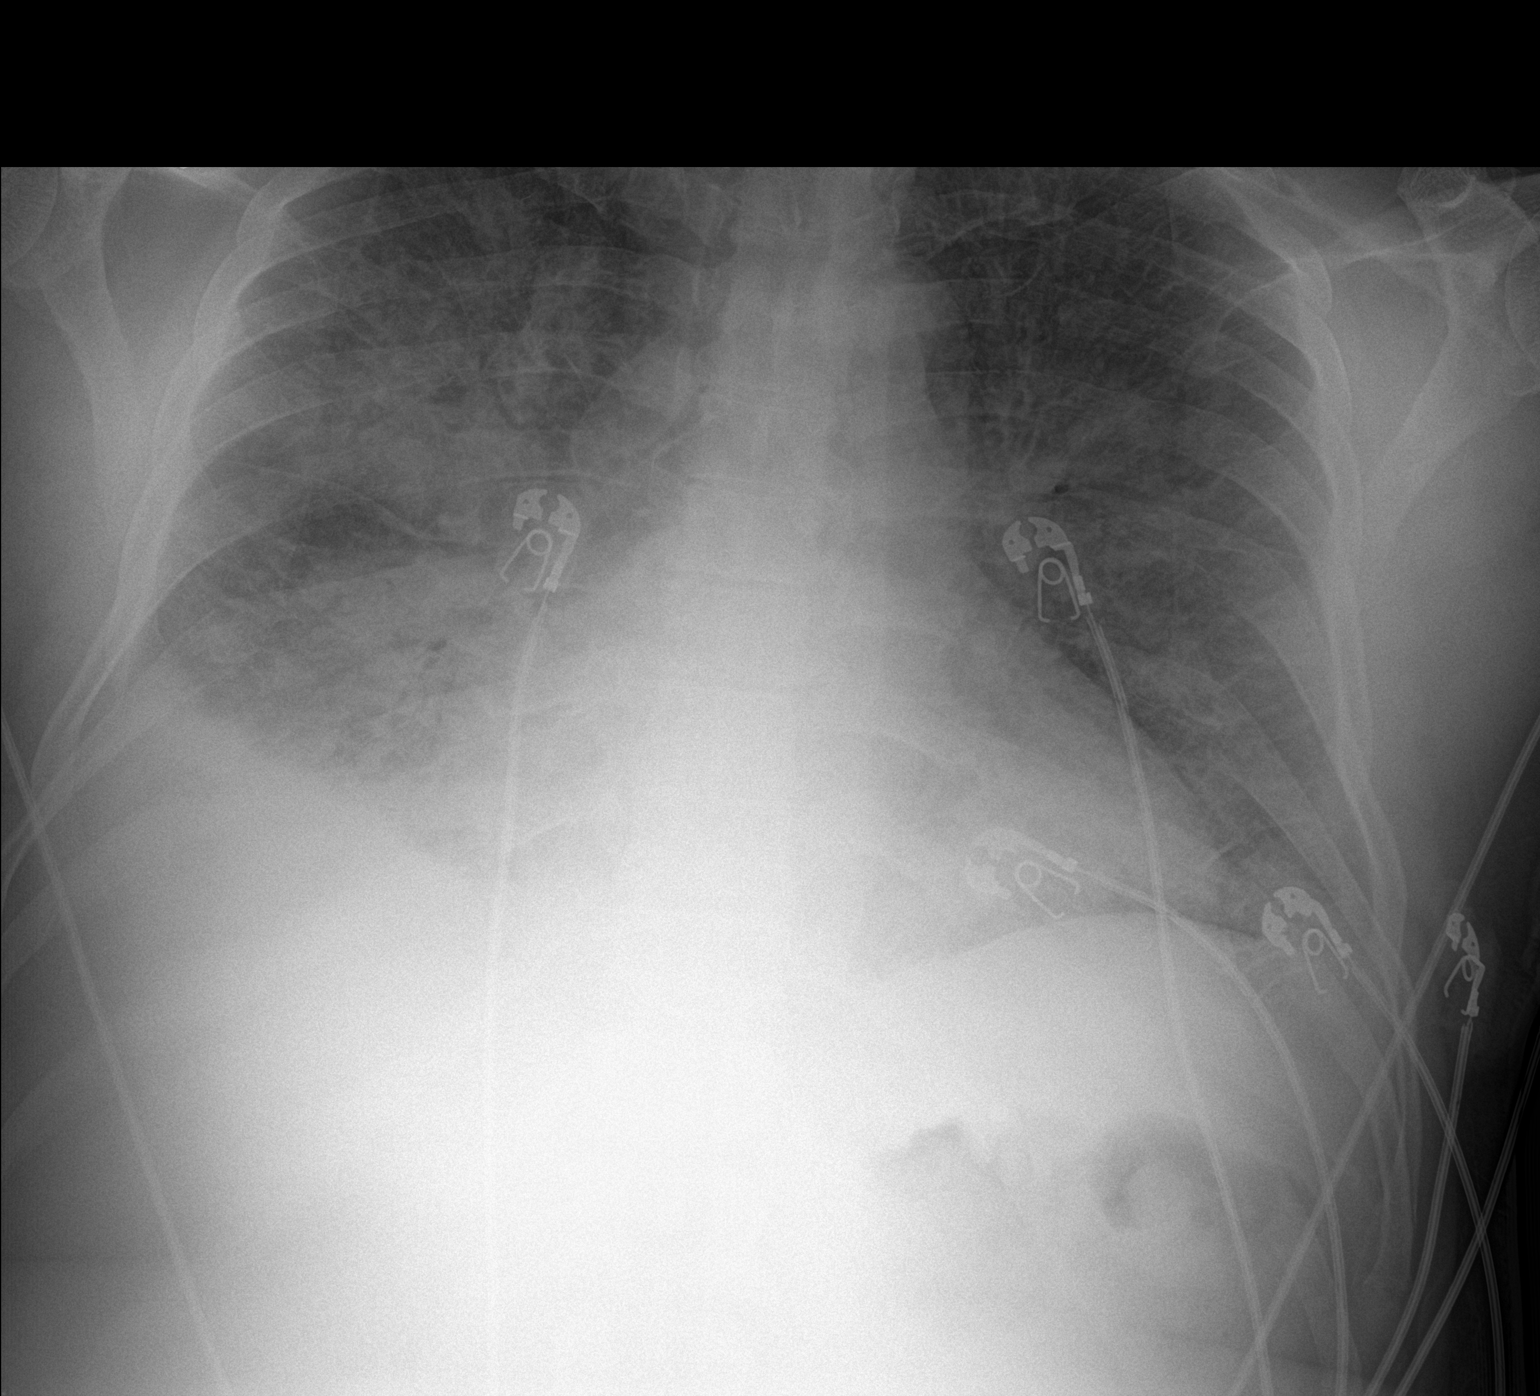

[1 of 1 positions shown; findings below may reference images not displayed]

FINDINGS: Mild cardiomegaly. Normal pulmonary vascularity. Diffuse
consolidative opacities in the right greater than left lungs. Small
right pleural effusion. No pneumothorax. No acute osseous
abnormality.
IMPRESSION: 1. Diffuse consolidative opacities in the right greater than left
lungs, which could reflect ARDS, edema, or hemorrhage.

## 2018-12-30 IMAGING — DX DG CHEST 1V PORT
1 series · 1 of 1 positions shown · non-contrast
Comparison: 04/24/2017.

CLINICAL DATA: 61-year-old male presenting with shortness of
Breath, presenting an Hypertensive emergency with pulmonary edema.
Atrial flutter.

EXAM:
PORTABLE CHEST 1 VIEW

[chest ap]
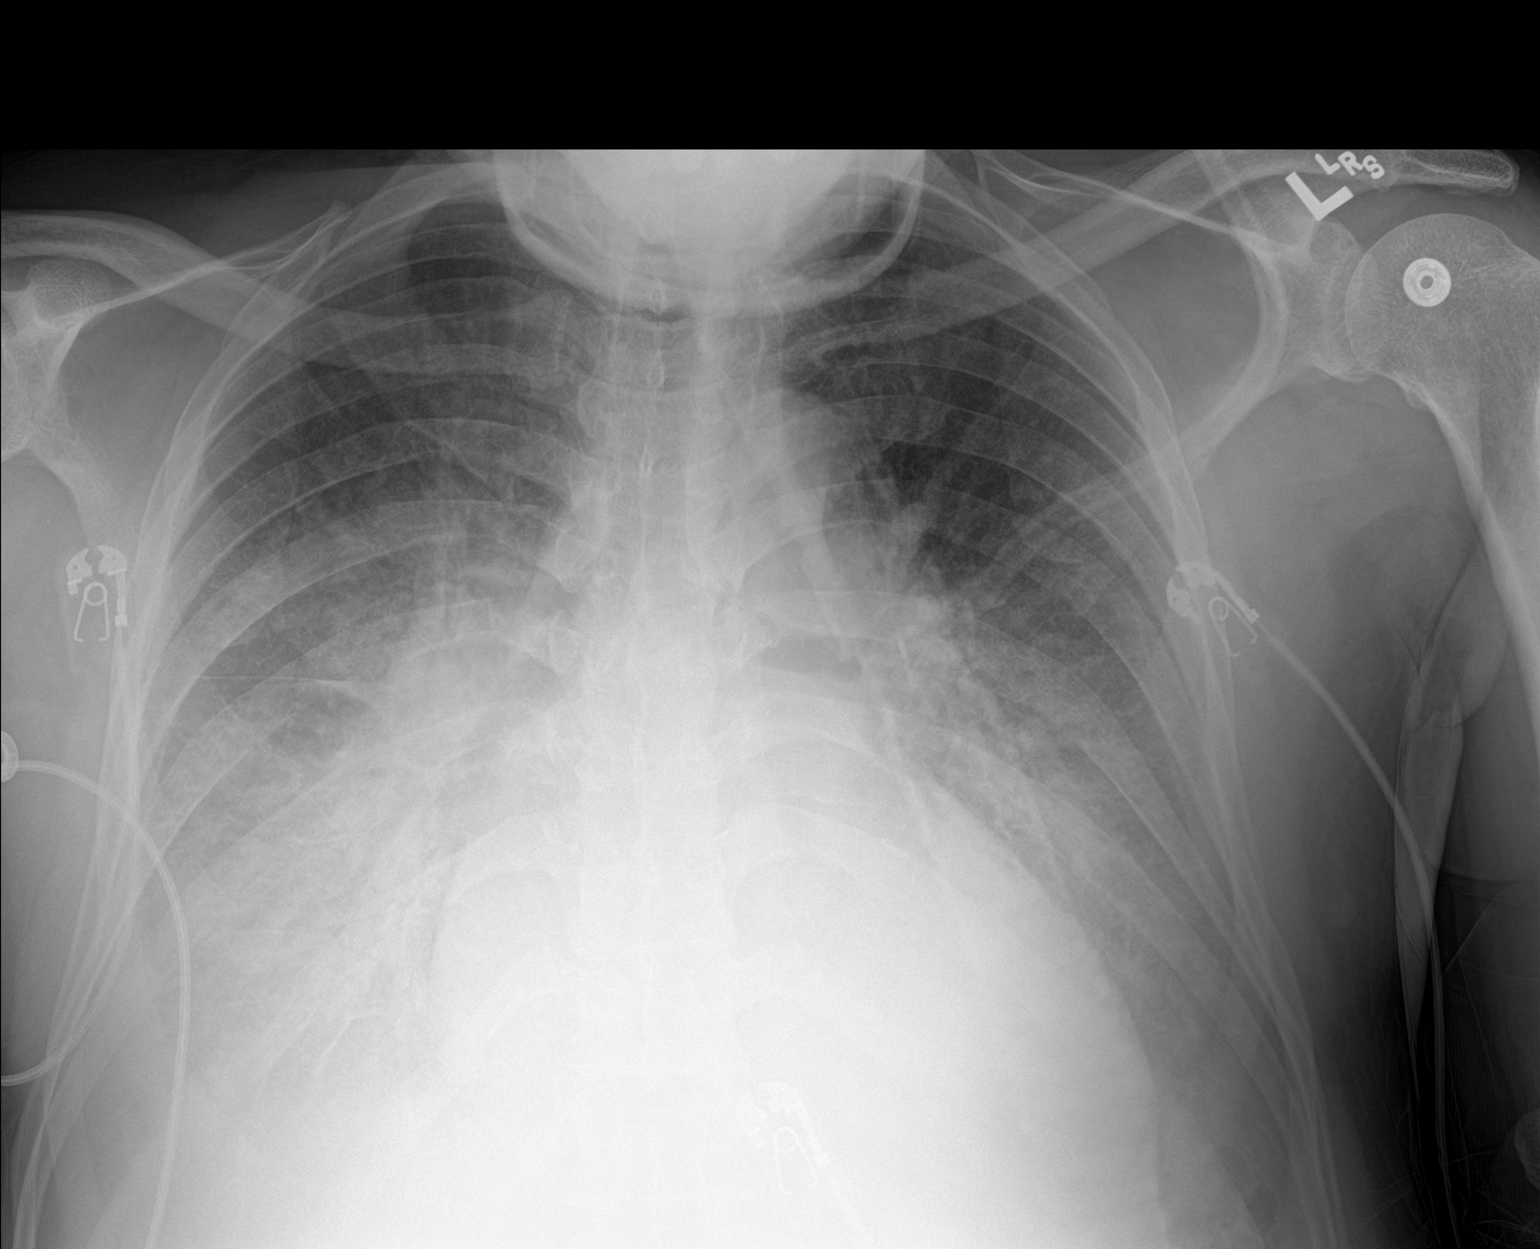

[1 of 1 positions shown; findings below may reference images not displayed]

FINDINGS: Portable AP semi upright view at 5303 hours. Bilateral veiling
pulmonary opacity in obscuration of the diaphragm. Dense
retrocardiac opacity appears increased. Stable cardiomegaly and
mediastinal contours. Minimally improved apical lung ventilation and
decreased vascularity. Visualized tracheal air column is within
normal limits.
IMPRESSION: Continued widespread pulmonary edema with suspected bilateral
pleural effusions, and bilateral lower lobe collapse/ consolidation.

## 2018-12-31 IMAGING — CR DG CHEST 1V PORT
1 series · 1 of 1 positions shown · non-contrast
Comparison: 04/25/2017

CLINICAL DATA: Pulmonary edema

EXAM:
PORTABLE CHEST 1 VIEW

[AP]
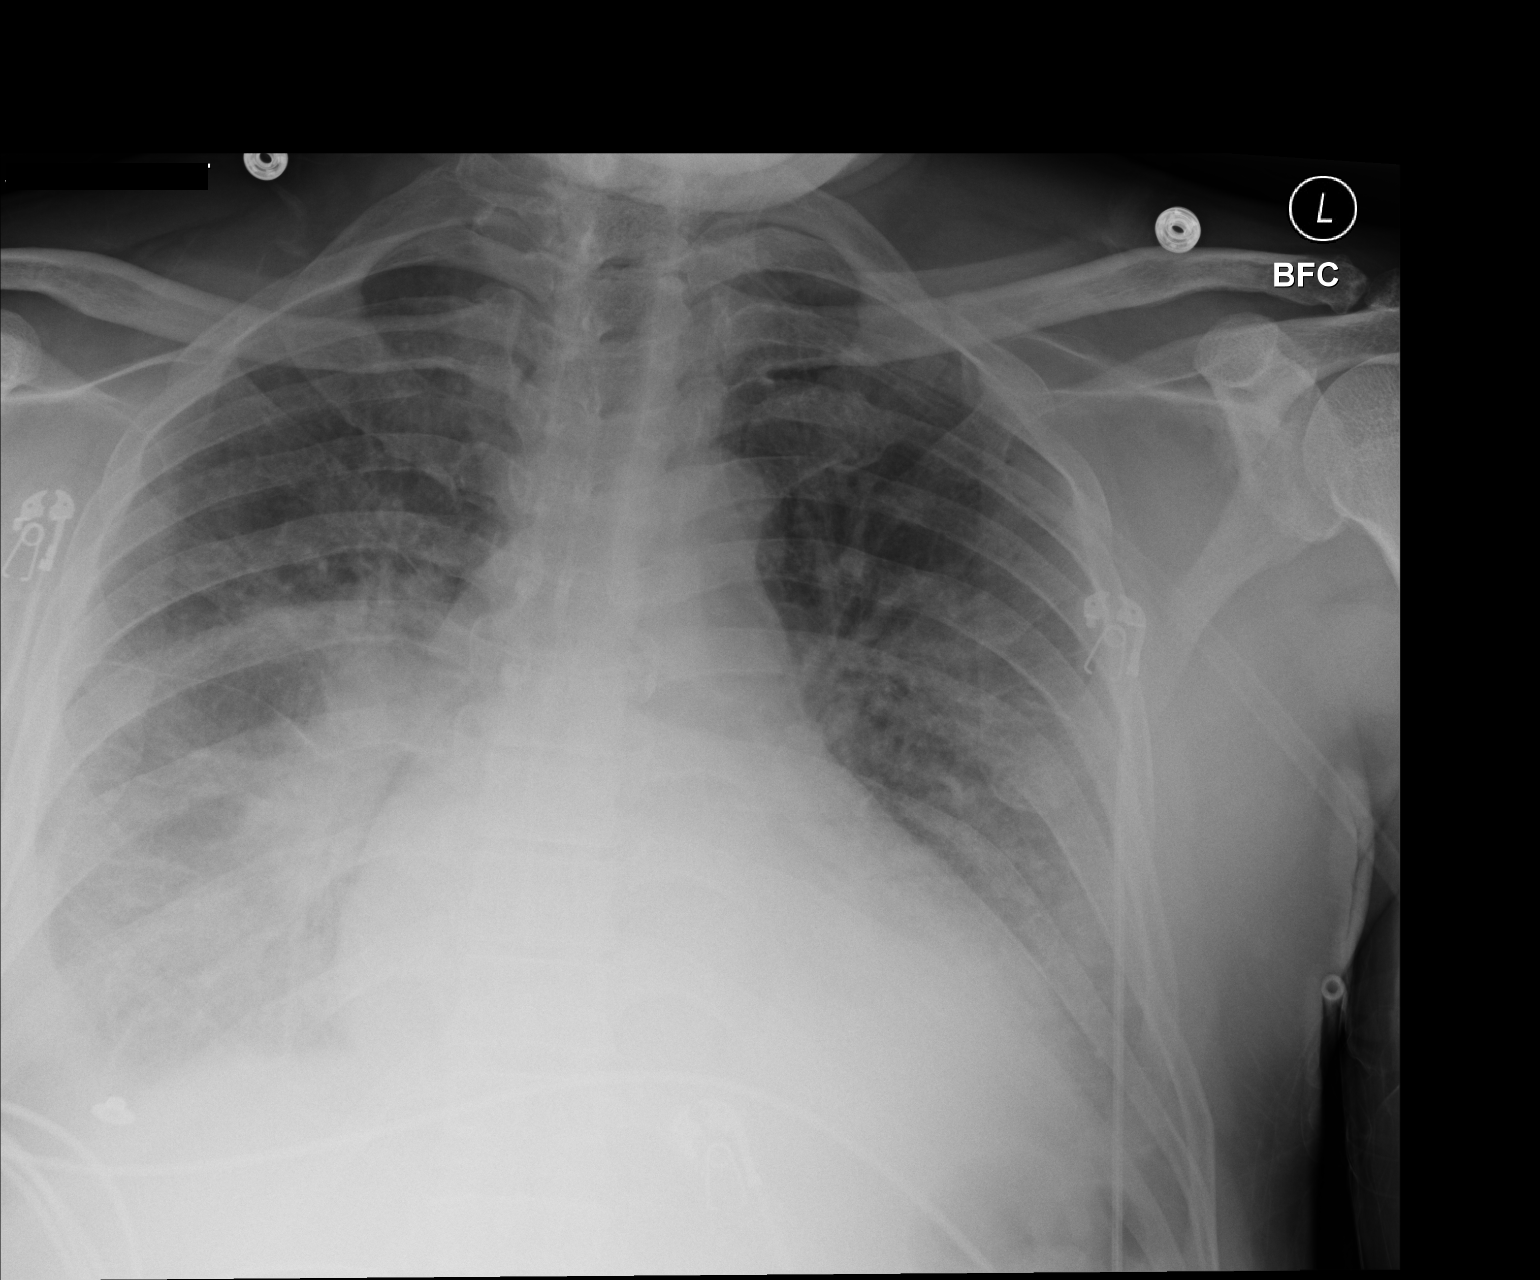

[1 of 1 positions shown; findings below may reference images not displayed]

FINDINGS: Diffuse bilateral airspace disease with basilar predominance is
mildly improved. Bilateral pleural effusions and bibasilar
atelectasis unchanged. Cardiac enlargement.
IMPRESSION: Congestive heart failure with diffuse edema. Mild interval
improvement in edema.

## 2019-01-14 ENCOUNTER — Telehealth: Payer: Self-pay

## 2019-01-14 ENCOUNTER — Other Ambulatory Visit: Payer: Self-pay | Admitting: Cardiovascular Disease

## 2019-01-14 NOTE — Telephone Encounter (Signed)
Left a detailed message for the patient to contact the office for a follow up. The patient has a past due follow up with Kerin Ransom, PA-C.

## 2019-01-15 ENCOUNTER — Other Ambulatory Visit: Payer: Self-pay | Admitting: Cardiology

## 2019-02-01 ENCOUNTER — Other Ambulatory Visit: Payer: Self-pay | Admitting: Cardiology

## 2019-03-09 ENCOUNTER — Other Ambulatory Visit: Payer: Self-pay | Admitting: Cardiology

## 2019-03-19 ENCOUNTER — Other Ambulatory Visit: Payer: Self-pay | Admitting: Cardiology

## 2019-03-28 ENCOUNTER — Other Ambulatory Visit: Payer: Self-pay | Admitting: Cardiology

## 2019-04-08 ENCOUNTER — Other Ambulatory Visit: Payer: Self-pay | Admitting: Cardiovascular Disease

## 2019-04-08 NOTE — Telephone Encounter (Signed)
Please advise if OK to refill. Refilled on 03/28/2019 with 3rd request to schedule appointment. Thank you.

## 2019-04-09 NOTE — Telephone Encounter (Signed)
Left message for patient requesting to have him callback to schedule appointment.

## 2019-04-10 ENCOUNTER — Telehealth: Payer: Self-pay | Admitting: Cardiovascular Disease

## 2019-04-10 ENCOUNTER — Other Ambulatory Visit: Payer: Self-pay | Admitting: Cardiology

## 2019-04-10 NOTE — Telephone Encounter (Signed)
° ° ° ° °  I called left a message on pt home phone voicemail asking pt to call and schedule a follow up visit with Dr C or his APP.

## 2019-04-12 ENCOUNTER — Other Ambulatory Visit: Payer: Self-pay | Admitting: Cardiology

## 2019-04-24 ENCOUNTER — Other Ambulatory Visit: Payer: Self-pay | Admitting: Cardiovascular Disease

## 2019-04-29 ENCOUNTER — Other Ambulatory Visit: Payer: Self-pay | Admitting: Cardiology

## 2019-05-28 ENCOUNTER — Other Ambulatory Visit: Payer: Self-pay | Admitting: Cardiovascular Disease

## 2019-06-24 ENCOUNTER — Other Ambulatory Visit: Payer: Self-pay | Admitting: Cardiology

## 2019-07-08 ENCOUNTER — Other Ambulatory Visit: Payer: Self-pay | Admitting: Cardiology

## 2019-07-27 ENCOUNTER — Other Ambulatory Visit: Payer: Self-pay | Admitting: Cardiovascular Disease

## 2019-08-18 ENCOUNTER — Other Ambulatory Visit: Payer: Self-pay | Admitting: Cardiology

## 2019-08-30 ENCOUNTER — Other Ambulatory Visit: Payer: Self-pay | Admitting: Cardiovascular Disease

## 2019-09-09 ENCOUNTER — Other Ambulatory Visit: Payer: Self-pay | Admitting: Cardiovascular Disease

## 2019-09-26 ENCOUNTER — Other Ambulatory Visit: Payer: Self-pay | Admitting: Cardiovascular Disease

## 2019-09-29 ENCOUNTER — Other Ambulatory Visit: Payer: Self-pay

## 2019-09-29 MED ORDER — APIXABAN 5 MG PO TABS: 5.0000 mg | ORAL_TABLET | Freq: Two times a day (BID) | ORAL | 1 refills | Status: DC

## 2019-10-14 ENCOUNTER — Other Ambulatory Visit: Payer: Self-pay | Admitting: Cardiovascular Disease

## 2020-05-06 ENCOUNTER — Other Ambulatory Visit: Payer: Self-pay | Admitting: Cardiovascular Disease

## 2020-05-12 ENCOUNTER — Other Ambulatory Visit: Payer: Self-pay | Admitting: Cardiology

## 2020-05-12 NOTE — Telephone Encounter (Signed)
Rx has been sent to the pharmacy electronically. ° °

## 2020-05-19 ENCOUNTER — Other Ambulatory Visit: Payer: Self-pay | Admitting: Cardiology

## 2020-06-08 ENCOUNTER — Ambulatory Visit: Payer: Self-pay

## 2020-06-08 ENCOUNTER — Ambulatory Visit: Payer: Self-pay | Attending: Internal Medicine

## 2020-06-08 DIAGNOSIS — Z23 Encounter for immunization: Secondary | ICD-10-CM

## 2020-06-08 NOTE — Progress Notes (Signed)
   Covid-19 Vaccination Clinic  Name:  Roger Humphrey    MRN: [id number] DOB: August 01, 1955  06/08/2020  [name] was observed post Covid-19 immunization for 15 minutes without incident. He was provided with Vaccine Information Sheet and instruction to access the V-Safe system.   [name] was instructed to call 911 with any severe reactions post vaccine: Marland Kitchen Difficulty breathing  . Swelling of face and throat  . A fast heartbeat  . A bad rash all over body  . Dizziness and weakness   Immunizations Administered    Name Date Dose VIS Date Route   Pfizer COVID-19 Vaccine 06/08/2020  2:09 PM 0.3 mL 03/17/2020 Intramuscular   Manufacturer: [employer], [employer]   Lot: [id number]   NDC: [address]-0

## 2020-06-09 ENCOUNTER — Encounter: Payer: Self-pay | Admitting: Cardiology

## 2020-06-21 ENCOUNTER — Other Ambulatory Visit: Payer: Self-pay | Admitting: Cardiovascular Disease

## 2020-06-29 ENCOUNTER — Ambulatory Visit: Payer: Self-pay | Attending: Internal Medicine

## 2020-06-29 DIAGNOSIS — Z23 Encounter for immunization: Secondary | ICD-10-CM

## 2020-06-29 NOTE — Progress Notes (Signed)
   Covid-19 Vaccination Clinic  Name:  Roger Humphrey    MRN: [id number] DOB: 1956-01-05  06/29/2020  Roger Humphrey was observed post Covid-19 immunization for 15 minutes without incident. He was provided with Vaccine Information Sheet and instruction to access the V-Safe system.   Roger Humphrey was instructed to call 911 with any severe reactions post vaccine: Marland Kitchen Difficulty breathing  . Swelling of face and throat  . A fast heartbeat  . A bad rash all over body  . Dizziness and weakness   Immunizations Administered    Name Date Dose VIS Date Route   PFIZER Comrnaty(Gray TOP) Covid-19 Vaccine 06/29/2020  1:31 PM 0.3 mL 05/06/2020 Intramuscular   Manufacturer: [employer], [employer]   Lot: [id number]   NDC: [phone]

## 2020-12-10 ENCOUNTER — Other Ambulatory Visit: Payer: Self-pay | Admitting: Cardiovascular Disease

## 2021-02-28 ENCOUNTER — Emergency Department (HOSPITAL_COMMUNITY): Payer: Self-pay

## 2021-02-28 ENCOUNTER — Emergency Department (HOSPITAL_COMMUNITY)
Admission: EM | Admit: 2021-02-28 | Discharge: 2021-02-28 | Disposition: A | Discharge status: Home / Self Care | Payer: Self-pay | Attending: Emergency Medicine | Admitting: Emergency Medicine

## 2021-02-28 DIAGNOSIS — R0602 Shortness of breath: Secondary | ICD-10-CM | POA: Insufficient documentation

## 2021-02-28 DIAGNOSIS — Z7901 Long term (current) use of anticoagulants: Secondary | ICD-10-CM | POA: Insufficient documentation

## 2021-02-28 DIAGNOSIS — Z87891 Personal history of nicotine dependence: Secondary | ICD-10-CM | POA: Insufficient documentation

## 2021-02-28 DIAGNOSIS — Z79899 Other long term (current) drug therapy: Secondary | ICD-10-CM | POA: Insufficient documentation

## 2021-02-28 DIAGNOSIS — I4891 Unspecified atrial fibrillation: Secondary | ICD-10-CM

## 2021-02-28 DIAGNOSIS — I110 Hypertensive heart disease with heart failure: Secondary | ICD-10-CM | POA: Insufficient documentation

## 2021-02-28 DIAGNOSIS — R059 Cough, unspecified: Secondary | ICD-10-CM | POA: Insufficient documentation

## 2021-02-28 DIAGNOSIS — I2510 Atherosclerotic heart disease of native coronary artery without angina pectoris: Secondary | ICD-10-CM | POA: Insufficient documentation

## 2021-02-28 DIAGNOSIS — I4892 Unspecified atrial flutter: Secondary | ICD-10-CM | POA: Insufficient documentation

## 2021-02-28 DIAGNOSIS — R1013 Epigastric pain: Secondary | ICD-10-CM | POA: Insufficient documentation

## 2021-02-28 DIAGNOSIS — I5042 Chronic combined systolic (congestive) and diastolic (congestive) heart failure: Secondary | ICD-10-CM | POA: Insufficient documentation

## 2021-02-28 LAB — CBC WITH DIFFERENTIAL/PLATELET
Abs Immature Granulocytes: 0.02 10*3/uL (ref 0.00–0.07)
Basophils Absolute: 0.1 10*3/uL (ref 0.0–0.1)
Basophils Relative: 1 %
Eosinophils Absolute: 0.4 10*3/uL (ref 0.0–0.5)
Eosinophils Relative: 7 %
HCT: 38.8 % — ABNORMAL LOW (ref 39.0–52.0)
Hemoglobin: 12.5 g/dL — ABNORMAL LOW (ref 13.0–17.0)
Immature Granulocytes: 0 %
Lymphocytes Relative: 25 %
Lymphs Abs: 1.5 10*3/uL (ref 0.7–4.0)
MCH: 30.6 pg (ref 26.0–34.0)
MCHC: 32.2 g/dL (ref 30.0–36.0)
MCV: 94.9 fL (ref 80.0–100.0)
Monocytes Absolute: 0.4 10*3/uL (ref 0.1–1.0)
Monocytes Relative: 6 %
Neutro Abs: 3.6 10*3/uL (ref 1.7–7.7)
Neutrophils Relative %: 61 %
Platelets: 340 10*3/uL (ref 150–400)
RBC: 4.09 MIL/uL — ABNORMAL LOW (ref 4.22–5.81)
RDW: 12.4 % (ref 11.5–15.5)
WBC: 5.9 10*3/uL (ref 4.0–10.5)
nRBC: 0 % (ref 0.0–0.2)

## 2021-02-28 LAB — BASIC METABOLIC PANEL
Anion gap: 9 (ref 5–15)
BUN: 11 mg/dL (ref 8–23)
CO2: 22 mmol/L (ref 22–32)
Calcium: 9 mg/dL (ref 8.9–10.3)
Chloride: 108 mmol/L (ref 98–111)
Creatinine, Ser: 1.22 mg/dL (ref 0.61–1.24)
GFR, Estimated: >60 mL/min (ref >60)
Glucose, Bld: 107 mg/dL — ABNORMAL HIGH (ref 70–99)
Potassium: 4 mmol/L (ref 3.5–5.1)
Sodium: 139 mmol/L (ref 135–145)

## 2021-02-28 LAB — BRAIN NATRIURETIC PEPTIDE: B Natriuretic Peptide: 324.1 pg/mL — ABNORMAL HIGH (ref 0.0–100.0)

## 2021-02-28 LAB — TROPONIN I (HIGH SENSITIVITY)
Troponin I (High Sensitivity): 38 ng/L — ABNORMAL HIGH (ref <18)
Troponin I (High Sensitivity): 37 ng/L — ABNORMAL HIGH (ref <18)

## 2021-02-28 MED ORDER — LOSARTAN POTASSIUM 25 MG PO TABS: 25.0000 mg | ORAL_TABLET | Freq: Every day | ORAL | 0 refills | Status: DC

## 2021-02-28 MED ORDER — DILTIAZEM HCL-DEXTROSE 125-5 MG/125ML-% IV SOLN (PREMIX)
5.0000 mg/h | INTRAVENOUS | Status: DC
  Filled 2021-02-28: qty 125

## 2021-02-28 MED ORDER — ONDANSETRON HCL 4 MG/2ML IJ SOLN: 4.0000 mg | Freq: Once | INTRAMUSCULAR | Status: DC

## 2021-02-28 MED ORDER — FENTANYL CITRATE PF 50 MCG/ML IJ SOSY: 50.0000 ug | PREFILLED_SYRINGE | Freq: Once | INTRAMUSCULAR | Status: DC

## 2021-02-28 MED ORDER — ALUM & MAG HYDROXIDE-SIMETH 200-200-20 MG/5ML PO SUSP
30.0000 mL | Freq: Once | ORAL | Status: AC
  Administered 2021-02-28: 30 mL via ORAL
  Filled 2021-02-28: qty 30

## 2021-02-28 MED ORDER — CARVEDILOL 6.25 MG PO TABS: 6.2500 mg | ORAL_TABLET | Freq: Two times a day (BID) | ORAL | 1 refills | Status: DC

## 2021-02-28 MED ORDER — METOPROLOL TARTRATE 5 MG/5ML IV SOLN
5.0000 mg | Freq: Once | INTRAVENOUS | Status: AC
  Administered 2021-02-28: 5 mg via INTRAVENOUS
  Filled 2021-02-28: qty 5

## 2021-02-28 MED ORDER — DILTIAZEM HCL 25 MG/5ML IV SOLN
10.0000 mg | Freq: Once | INTRAVENOUS | Status: AC
  Administered 2021-02-28: 10 mg via INTRAVENOUS
  Filled 2021-02-28: qty 5

## 2021-02-28 NOTE — ED Notes (Signed)
Pt ambulatory at d/c. Steady on feet. Sister picking pt up.

## 2021-02-28 NOTE — ED Notes (Signed)
I introduced myself to pt. Pt AxO x4 with a GCS of 15. Still c/o 10/10 LUQ abdominal pain radiating through rest of abdomen. BP high, see order for BB. Pt said he was taking BP medication and eliquis, but the BP medication didn't get refilled and he couldn't afford the eliquis. Call light within reach and bed rails up. Pt denies further needs.

## 2021-02-28 NOTE — ED Notes (Signed)
Pt remains hypertensive Sarah PA made aware.

## 2021-02-28 NOTE — ED Provider Notes (Signed)
Signout note  65 year old gentleman with history of atrial flutter, heart failure, hypertension presenting to ER with concern for shortness of breath.  Initial EKG showing atrial flutter.  Case was reviewed with cardiology who recommended starting Cardizem.  Patient given single dose of IV Cardizem and appeared to convert to sinus rhythm, rate is 90.  Repeat EKG and troponins were reviewed with cardiology who recommended outpatient management from cardiac standpoint.  On reassessment, patient's symptoms had markedly improved but still was complaining of some shortness of breath.  At time of signout plan was to reassess patient and determine disposition.  7:00 PM received sign out from [employer] - I went and reassessed patient, he was previously on Eliquis which was listed as home medication in epic however patient states that he is not currently taking a blood thinner.  This raises possibility of pulmonary embolism is source of his symptoms today.  Recommended to patient that he remain in ER for CTA PE study and further observation.  Discussed risks and benefits of further testing/observation versus discharge however patient states that he will leave without any further testing.  He was agreeable to follow-up with his cardiologist.  Reviewed very strict return precautions and ultimately discharged.   Milagros Loll, MD 02/28/21 [id number]

## 2021-02-28 NOTE — ED Notes (Signed)
I d/c'd patient's IV and pt was dressed. Afterwards Dr. Rito Ehrlich to do CT and to give meds. I went in to place an IV and pt refused and wants to go home. Dr. Stevie Kern informed.

## 2021-02-28 NOTE — ED Notes (Signed)
Pt in X ray

## 2021-02-28 NOTE — ED Notes (Signed)
Per PA, to hold cardizem gtt currently since pt's rate is controlled.

## 2021-02-28 NOTE — ED Notes (Signed)
Pt ambulatory to restroom. Steady on feet. Denies further needs. 

## 2021-02-28 NOTE — ED Triage Notes (Signed)
Pt to ED via EMS from work site, pt was painting a house, became [facility], on EMS arrival pt HR noted to be 105-145 irregular. Initially increase work of breathing, which has resolved ; 95%RA. #18 lac, fluid bolus given by EMS. Reports HX of same type of episode, but does not remember what his dx was. Last VS:178/110, rr 18, 95%RA.

## 2021-02-28 NOTE — Discharge Instructions (Addendum)
Please call and make an appointment with your cardiologist within the next week for further follow-up of your condition.  I have written for you to have a refill of your blood pressure medications.  It is extremely important that you take these as prescribed, as they will likely remedy the shortness of breath and high blood pressure that you are experiencing.  Return if symptoms worsen.

## 2021-02-28 NOTE — ED Provider Notes (Signed)
[facility] EMERGENCY DEPARTMENT Provider Note   CSN: [id number] Arrival date & time: 02/28/21  1157     History Chief Complaint  Patient presents with   Abdominal Pain   Shortness of Breath    Roger Humphrey is a 65 y.o. male.  Patient with history of a flutter, CHF, hypertension presents today with shortness of breath.  Patient states that he was painting in a house and had acute onset shortness of breath which has been constant since. Also endorses cough productive of clear sputum. Patient has history of cocaine abuse, says last use was this past Sunday.  States he sometimes feels this way after cocaine use.  Of note, patient has not been taking his medications for some time given issues with getting in to see cardiology for follow-up and refills. Denies leg pain or swelling, recent travel, surgeries, long periods of immobilization. No recent illness or sick contacts. Denies fevers, chills, dizziness, lightheadedness, or chest pain.  Also endorses abdominal pain in the epigastric region that has been present and unchanged for 3 months.  He has not been evaluated for same.  States that pain comes on after spicy foods, relieved with milk.  He used to take Zantac for symptoms, however was informed of the long-term effects of chronic Zantac use and therefore stopped treatment.   The history is provided by the patient. No language interpreter was used.  Abdominal Pain Associated symptoms: cough and shortness of breath   Associated symptoms: no chest pain, no chills, no constipation, no diarrhea, no fatigue, no fever, no nausea, no sore throat and no vomiting   Shortness of Breath Associated symptoms: abdominal pain and cough   Associated symptoms: no chest pain, no fever, no headaches, no sore throat, no vomiting and no wheezing       Past Medical History:  Diagnosis Date   Acute systolic CHF (congestive heart failure) (HCC) 04/24/2017   Atrial flutter, paroxysmal  (HCC) 03/2017   Chronic anticoagulation 03/2017   Eliquis   Hypertension     Patient Active Problem List   Diagnosis Date Noted   Non-compliance with treatment 12/11/2017   Chronic combined systolic and diastolic CHF (congestive heart failure) (HCC) 12/11/2017   NICM (nonischemic cardiomyopathy) (HCC) 06/01/2017   Hypertension    NSTEMI (non-ST elevated myocardial infarction) (HCC)    Acute systolic CHF (congestive heart failure) (HCC)    Coronary artery disease involving native coronary artery    Acute renal failure (HCC)    Flash pulmonary edema (HCC)    Acute respiratory failure with hypoxia (HCC)    Cocaine abuse (HCC)    AKI (acute kidney injury) (HCC)    Demand ischemia of myocardium (HCC)    Hypertensive urgency 04/24/2017   Chronic anticoagulation 03/29/2017   Atrial flutter, paroxysmal (HCC) 03/29/2017    Past Surgical History:  Procedure Laterality Date   FACIAL LACERATION REPAIR  05/2010       Family History  Problem Relation Age of Onset   Heart murmur Father    Hypertension Father     Social History   Tobacco Use   Smoking status: Former    Types: Cigarettes    Start date: 05/15/2005    Quit date: 04/16/2017    Years since quitting: 3.8   Smokeless tobacco: Never  Substance Use Topics   Drug use: Yes    Home Medications Prior to Admission medications   Medication Sig Start Date End Date Taking? Authorizing Provider  apixaban (ELIQUIS) 5  MG TABS tablet Take 1 tablet (5 mg total) by mouth 2 (two) times daily. 09/29/19   Abelino Derrick, PA-C  carvedilol (COREG) 6.25 MG tablet TAKE 1 TABLET (6.25 MG TOTAL) BY MOUTH 2 (TWO) TIMES DAILY WITH A MEAL. 06/22/20   Croitoru, Mihai, MD  furosemide (LASIX) 40 MG tablet TAKE 1 TABLET BY MOUTH EVERY DAY 05/19/20   Croitoru, Mihai, MD  losartan (COZAAR) 25 MG tablet TAKE 1 TABLET (25 MG TOTAL) BY MOUTH DAILY. **NEED OFFICE VISIT** 05/06/20   Croitoru, Mihai, MD  metolazone (ZAROXOLYN) 2.5 MG tablet Take 1 tablet  (2.5 mg total) by mouth as directed. Take one tablet on 06/15/17, 06/16/17 and 06/17/17--30 minutes before your furosemide; then, starting next week on 06/19/17 take one tablet twice weekly on Tuesday and Friday only. 06/15/17 12/11/17  Abelino Derrick, PA-C  potassium chloride (K-DUR) 10 MEQ tablet Take 1 tablet (10 mEq total) by mouth daily. 05/15/17 12/11/17  Barrett, Joline Salt, PA-C    Allergies    Patient has no known allergies.  Review of Systems   Review of Systems  Constitutional:  Negative for chills, fatigue and fever.  HENT:  Negative for congestion, postnasal drip, rhinorrhea, sore throat, trouble swallowing and voice change.   Respiratory:  Positive for cough and shortness of breath. Negative for apnea, choking, chest tightness, wheezing and stridor.   Cardiovascular:  Negative for chest pain, palpitations and leg swelling.  Gastrointestinal:  Positive for abdominal pain. Negative for abdominal distention, blood in stool, constipation, diarrhea, nausea and vomiting.  Neurological:  Negative for dizziness, tremors, seizures, syncope, facial asymmetry, speech difficulty, weakness, light-headedness, numbness and headaches.  Psychiatric/Behavioral:  Negative for confusion and decreased concentration.   All other systems reviewed and are negative.  Physical Exam Updated Vital Signs BP (!) 175/129   Pulse (!) 119   Temp 98.2 F (36.8 C)   Resp (!) 30   Ht 5\' 8"  (1.727 m)   Wt 96.2 kg   SpO2 99%   BMI 32.23 kg/m   Physical Exam Vitals and nursing note reviewed.  Constitutional:      General: He is not in acute distress.    Appearance: Normal appearance. He is normal weight. He is not ill-appearing, toxic-appearing or diaphoretic.  HENT:     Head: Normocephalic and atraumatic.  Cardiovascular:     Rate and Rhythm: Tachycardia present. Rhythm irregularly irregular. No extrasystoles are present.    Pulses: Normal pulses. No decreased pulses.          Radial pulses are 2+ on the  right side and 2+ on the left side.     Heart sounds: Normal heart sounds. No murmur heard.   No friction rub. No gallop.     Comments: Patient initially noted to be in A. fib RVR with a rate in the 140s.  Following bolus of diltiazem, patient converted to sinus tachycardia in the 100s.  Patient endorses significant improvement in shortness of breath upon conversion to sinus rhythm. Pulmonary:     Effort: Pulmonary effort is normal. No respiratory distress.     Breath sounds: Normal breath sounds. No stridor. No decreased breath sounds, wheezing, rhonchi or rales.     Comments: Lungs clear to auscultation in all fields Chest:     Chest wall: No tenderness.  Abdominal:     General: Abdomen is flat. Bowel sounds are normal. There is no distension. There are no signs of injury.     Palpations: Abdomen is soft.  Tenderness: There is no abdominal tenderness. There is no right CVA tenderness, left CVA tenderness, guarding or rebound. Negative signs include Murphy's sign, Rovsing's sign, McBurney's sign, psoas sign and obturator sign.     Comments: Patient's abdomen is nontender with light and deep palpation in all quadrants.  Musculoskeletal:        General: Normal range of motion.     Cervical back: Normal range of motion.     Right lower leg: No tenderness. No edema.     Left lower leg: No tenderness. No edema.  Skin:    General: Skin is warm and dry.  Neurological:     General: No focal deficit present.     Mental Status: He is alert.  Psychiatric:        Mood and Affect: Mood normal.        Behavior: Behavior normal.    ED Results / Procedures / Treatments   Labs (all labs ordered are listed, but only abnormal results are displayed) Labs Reviewed  BASIC METABOLIC PANEL - Abnormal; Notable for the following components:      Result Value   Glucose, Bld 107 (*)    All other components within normal limits  CBC WITH DIFFERENTIAL/PLATELET - Abnormal; Notable for the following  components:   RBC 4.09 (*)    Hemoglobin 12.5 (*)    HCT 38.8 (*)    All other components within normal limits  BRAIN NATRIURETIC PEPTIDE - Abnormal; Notable for the following components:   B Natriuretic Peptide 324.1 (*)    All other components within normal limits  TROPONIN I (HIGH SENSITIVITY) - Abnormal; Notable for the following components:   Troponin I (High Sensitivity) 38 (*)    All other components within normal limits  TROPONIN I (HIGH SENSITIVITY) - Abnormal; Notable for the following components:   Troponin I (High Sensitivity) 37 (*)    All other components within normal limits    EKG None  Radiology DG Chest 2 View  Result Date: 02/28/2021 CLINICAL DATA:  Shortness of breath. EXAM: CHEST - 2 VIEW COMPARISON:  Chest x-ray 04/17/2017. FINDINGS: The heart is enlarged. There is central interstitial prominence bilaterally. There is some patchy opacities in both lung bases. There is no pleural effusion or pneumothorax. Osseous structures are within normal limits. IMPRESSION: Cardiomegaly with moderate pulmonary edema. Electronically Signed   By: Darliss Cheney M.D.   On: 02/28/2021 15:48    Procedures Procedures   Medications Ordered in ED Medications  diltiazem (CARDIZEM) 125 mg in dextrose 5% 125 mL (1 mg/mL) infusion (0 mg/hr Intravenous Hold 02/28/21 1531)  fentaNYL (SUBLIMAZE) injection 50 mcg (50 mcg Intravenous Not Given 02/28/21 2007)  [facility]) injection 4 mg (4 mg Intravenous Not Given 02/28/21 2007)  alum & mag hydroxide-simeth (MAALOX/MYLANTA) 200-200-20 MG/5ML suspension 30 mL (30 mLs Oral Given 02/28/21 1309)  diltiazem (CARDIZEM) injection 10 mg (10 mg Intravenous Given 02/28/21 1340)  metoprolol tartrate (LOPRESSOR) injection 5 mg (5 mg Intravenous Given 02/28/21 1553)    ED Course  I have reviewed the triage vital signs and the nursing notes.  Pertinent labs & imaging results that were available during my care of the patient were reviewed by me and  considered in my medical decision making (see chart for details).    MDM Rules/Calculators/A&P                         Patient presents to the ED with complaints of shortness  of breath.  Patient found to be in A. fib RVR with a rate in the 140s.  Given bolus of diltiazem for same which converted him to sinus rhythm, however still tachycardic in the 100s.  Endorsed some improvement on conversion to sinus rhythm, however still mildly short of breath.   Patient satting 100% on room air throughout stay, however given 2 L O2 via nasal cannula for comfort.  Returned to room air, patient remained 100% O2 saturation on ambulation. Lung sounds clear to auscultation in all fields.  Ddx: The emergent differential diagnosis for shortness of breath includes, but is not limited to, Pulmonary edema, bronchoconstriction, Pneumonia, Pulmonary embolism, Pneumotherax/ Hemothorax, Dysrythmia, ACS.    Additional history obtained:  Additional history obtained from chart review & nursing note review.   Lab Tests:  I Ordered, reviewed, and interpreted labs, which included:  BMP, CBC, BNP, troponin BMP unremarkable, CBC shows no leukocytosis, some mild anemia, however this appears to be baseline for the patient.  BNP no change from 3 years ago, low suspicion for heart failure exacerbation.  Troponins flat.   Imaging Studies ordered:  I ordered imaging studies which included chest x-ray, I independently reviewed, formal radiology impression shows:  Cardiomegaly with moderate pulmonary edema  ED Course:  Patient with cardiac history including hypertension, NSTEMI, including CAD, A. Fib presents today with acute onset shortness of breath. Found to be hypertensive in the 170s systolic and in afib rvr with a rate in the 140s. Patient converted to sinus rhythm with diltizem bolus.  Patient remained hypertensive and mildly tachycardic, given 5 of metoprolol for same with condition unchanged.   Patient found to have new  EKG changes from prior in 2019, discussed this with cardiologist Dr. Eden Emms who states that findings are benign and does not feel that these changes require admission today.  Close cardiologist follow-up recommended.  Patient was found to not be taking his medications which include carvedilol, Eliquis, Lasix, losartan.  Suspect this is the cause of patient's symptoms, and reason that patient was found in A. fib RVR today and also why he is hypertensive.  Will refill patient's medications and emphasized importance of close cardiology follow-up and continuing to comply with medication regimen recommended by them.   Also discussed with patient his 3 months of generalized abdominal pain.  Peritoneal signs negative, no sign of acute abdomen today.  Patient's symptoms are unchanged from baseline.  Patient is afebrile, nontoxic-appearing, and in no acute distress. Denies changes in bowel habits, nausea, vomiting, diarrhea, constipation, hematuria, hematochezia, melena, dysuria.  Patient states that pain is localized to epigastric region, increases upon ingestion of spicy foods, relieved with milk.  Patient used to take Zantac for same, however stopped taking it.  Give chronic unchanged nature of symptoms, low suspicion for AAA, gastroenteritis, appendicitis, Bowel obstruction, Bowel perforation, mesenteric ischemia, pancreatitis, peritonitis SBP, volvulus. Will recommend supportive care and primary care follow-up for further management of the symptoms.     Upon reassessment, patient still somewhat short of breath despite oxygen saturation remaining 100 on room air upon ambulation. Is also mildly tachycardic in the 100s. Originally, patient was thought to be anticoagulated with Elliquis and so work-up for pulmonary embolus was not thought to be required. However, upon discovery of medication noncompliance, discussed case with Dr. Stevie Kern at shift change who agreed to see patient and make further assessment about  necessity of PE workup.  Patient care hand-off to Dr. Stevie Kern at shift change.  Portions of this note were  generated with Scientist, clinical (histocompatibility and immunogenetics). Dictation errors may occur despite best attempts at proofreading.    This is a shared visit with supervising physician Dr. Estell Harpin who has independently evaluated patient & provided guidance in evaluation/management/disposition, in agreement with care     Final Clinical Impression(s) / ED Diagnoses Final diagnoses:  None    Rx / DC Orders ED Discharge Orders     None        Vear Clock 02/28/21 2339    Bethann Berkshire, MD 03/05/21 1116

## 2021-02-28 NOTE — ED Notes (Signed)
Pt's back of stretcher sat up. Denies further needs. Still waiting for cardiology.

## 2021-02-28 NOTE — ED Notes (Signed)
Pt standing up at bedside because stretcher is uncomfortable. Pt steady on feet. Pt says he's in/out of shortness of breath, but I told him he's also in and out of A fib/SR. I explained what that meant.

## 2021-03-03 ENCOUNTER — Telehealth: Payer: Self-pay | Admitting: Surgery

## 2021-03-31 ENCOUNTER — Ambulatory Visit (INDEPENDENT_AMBULATORY_CARE_PROVIDER_SITE_OTHER): Payer: Self-pay | Admitting: Student

## 2021-03-31 ENCOUNTER — Other Ambulatory Visit (HOSPITAL_COMMUNITY): Payer: Self-pay

## 2021-03-31 ENCOUNTER — Other Ambulatory Visit: Payer: Self-pay

## 2021-03-31 ENCOUNTER — Encounter: Payer: Self-pay | Admitting: Student

## 2021-03-31 VITALS — BP 127/76 | HR 82 | Temp 97.8°F | Ht 68.0 in | Wt 231.9 lb

## 2021-03-31 DIAGNOSIS — I2510 Atherosclerotic heart disease of native coronary artery without angina pectoris: Secondary | ICD-10-CM

## 2021-03-31 DIAGNOSIS — Z0000 Encounter for general adult medical examination without abnormal findings: Secondary | ICD-10-CM

## 2021-03-31 DIAGNOSIS — I4892 Unspecified atrial flutter: Secondary | ICD-10-CM

## 2021-03-31 MED ORDER — APIXABAN 5 MG PO TABS
5.0000 mg | ORAL_TABLET | Freq: Two times a day (BID) | ORAL | 3 refills | Status: DC
  Filled 2021-03-31: qty 30, 15d supply, fill #0
  Filled 2021-04-04: qty 6, 3d supply, fill #0

## 2021-03-31 MED ORDER — SPIRONOLACTONE 25 MG PO TABS
25.0000 mg | ORAL_TABLET | Freq: Every day | ORAL | 2 refills | Status: DC
  Filled 2021-03-31: qty 30, 30d supply, fill #0

## 2021-03-31 MED ORDER — ATORVASTATIN CALCIUM 40 MG PO TABS
40.0000 mg | ORAL_TABLET | Freq: Every day | ORAL | 2 refills | Status: DC
  Filled 2021-03-31: qty 30, 30d supply, fill #0
  Filled 2021-05-30: qty 30, 30d supply, fill #1
  Filled 2021-06-25: qty 30, 30d supply, fill #2

## 2021-03-31 MED ORDER — DAPAGLIFLOZIN PROPANEDIOL 10 MG PO TABS
10.0000 mg | ORAL_TABLET | Freq: Every day | ORAL | 3 refills | Status: DC
  Filled 2021-03-31: qty 30, 30d supply, fill #0
  Filled 2021-05-07: qty 30, 30d supply, fill #1
  Filled 2021-06-04: qty 30, 30d supply, fill #2
  Filled 2021-07-23 – 2021-09-19 (×2): qty 30, 30d supply, fill #3

## 2021-03-31 MED ORDER — FUROSEMIDE 40 MG PO TABS
40.0000 mg | ORAL_TABLET | Freq: Every day | ORAL | 2 refills | Status: DC
  Filled 2021-03-31: qty 30, 30d supply, fill #0
  Filled 2022-03-27: qty 30, 30d supply, fill #1

## 2021-03-31 MED ORDER — CARVEDILOL 6.25 MG PO TABS
6.2500 mg | ORAL_TABLET | Freq: Two times a day (BID) | ORAL | 3 refills | Status: DC
  Filled 2021-03-31: qty 60, 30d supply, fill #0
  Filled 2021-05-30: qty 60, 30d supply, fill #1
  Filled 2021-06-17: qty 60, 30d supply, fill #2

## 2021-03-31 MED ORDER — ENTRESTO 49-51 MG PO TABS
1.0000 | ORAL_TABLET | Freq: Two times a day (BID) | ORAL | 3 refills | Status: DC
  Filled 2021-03-31: qty 60, 30d supply, fill #0
  Filled 2021-05-07 – 2021-05-30 (×3): qty 60, 30d supply, fill #1

## 2021-03-31 NOTE — Patient Instructions (Signed)
Thank you, Mr.Breyton Routh for allowing [place] to provide your care today. Today we discussed .  Heart Disease We will be performing blood work at the request of your cardiologist. I have also placed a referral to our pharmacy team to help discuss affordability of your medicines.   Please call our clinic if you ever run out of your medicines. Also please go to the hospital or call 911 if you have chest pain or symptoms of a stroke (facial droop, numbness/tingling, etc).     I have ordered the following labs for you:   Lab Orders         Lipid Profile         Hepatic function panel         BMP8+Anion Gap         TSH       Referrals ordered today:    Referral Orders         Amb Referral to Clinical Pharmacist      I have ordered the following medication/changed the following medications:   Stop the following medications: There are no discontinued medications.   Start the following medications: No orders of the defined types were placed in this encounter.    Follow up: As needed   Should you have any questions or concerns please call the internal medicine clinic at [phone].    Thalia Bloodgood, D.O. [facility]

## 2021-03-31 NOTE — Progress Notes (Signed)
   CC: Atrial Flutter  HPI:  [name] is a 65 y.o. male with a past medical history stated below and presents today for follow up of his atrial flutter, to discuss medication refills, and labs requested by his cardiologist. Please see problem based assessment and plan for additional details.  Past Medical History:  Diagnosis Date   Acute systolic CHF (congestive heart failure) (HCC) 04/24/2017   Atrial flutter, paroxysmal (HCC) 03/2017   Chronic anticoagulation 03/2017   Eliquis   Hypertension     No current outpatient medications on file prior to visit.   No current facility-administered medications on file prior to visit.    Family History  Problem Relation Age of Onset   Heart murmur Father    Hypertension Father     Social History   Socioeconomic History   Marital status: Married    Spouse name: Not on file   Number of children: Not on file   Years of education: Not on file   Highest education level: Not on file  Occupational History   Not on file  Tobacco Use   Smoking status: Former    Types: Cigarettes    Start date: 05/15/2005    Quit date: 04/16/2017    Years since quitting: 3.9   Smokeless tobacco: Never  Substance and Sexual Activity   Alcohol use: Not on file   Drug use: Yes   Sexual activity: Not on file  Other Topics Concern   Not on file  Social History Narrative   ** Merged History Encounter **       Social Determinants of Health   Financial Resource Strain: Not on file  Food Insecurity: Not on file  Transportation Needs: Not on file  Physical Activity: Not on file  Stress: Not on file  Social Connections: Not on file  Intimate Partner Violence: Not on file    Review of Systems: ROS negative except for what is noted on the assessment and plan.  Vitals:   03/31/21 0923  BP: 127/76  Pulse: 82  Temp: 97.8 F (36.6 C)  TempSrc: Oral  SpO2: 99%  Weight: 231 lb 14.4 oz (105.2 kg)  Height: 5\' 8"  (1.727 m)      Physical Exam: Constitutional: Well-appearing, no acute distress HENT: normocephalic atraumatic Eyes: conjunctiva non-erythematous Neck: supple Cardiovascular: Irregular regular rhythm, systolic murmur best heard over apex Pulmonary/Chest: normal work of breathing on room air, lungs clear to auscultation bilaterally MSK: normal bulk and tone Neurological: alert & oriented x 3 Skin: warm and dry Psych: Normal mood and thought process   Assessment & Plan:   See Encounters Tab for problem based charting.  Patient discussed with Dr. , D.O. [facility], PGY-2 Pager: [phone], Phone: [phone] Date 03/31/2021 Time 6:24 PM

## 2021-03-31 NOTE — Assessment & Plan Note (Signed)
Hepatitis C antibody testing performed today with blood work

## 2021-03-31 NOTE — Assessment & Plan Note (Addendum)
Assessment: Patient presented to the clinic today per request of his cardiologist to have laboratory work done, BMP, hepatic function panel, lipid panel, TSH.  Patient also states that he is a difficult time in the past affording his medications.  Discussed with him that if we convert all of his medicines to the [facility] outpatient clinic many of his medications are on a $4 list and could be more affordable.  His Eliquis is not on the $4 list however Xarelto is.  Will call patient and discuss transitioning from Eliquis to Xarelto.  We will also send a message to patient's cardiologist concerning this.  Plan: -Pending lab work as per above -Transition patient's medications to [facility] outpatient pharmacy -Discontinue Eliquis and start Xarelto  Addendum Lipid panel unremarkable.  BMP with elevated potassium of 5.3.  Patient on an ARB as well as spironolactone which can cause elevation in potassium.  Will plan to half spiro to 12.5 and transition patient from eliquis to xarelto.

## 2021-04-01 LAB — HEPATIC FUNCTION PANEL
ALT: 14 IU/L (ref 0–44)
AST: 16 IU/L (ref 0–40)
Albumin: 4.5 g/dL (ref 3.8–4.8)
Alkaline Phosphatase: 97 IU/L (ref 44–121)
Bilirubin Total: 0.2 mg/dL (ref 0.0–1.2)
Bilirubin, Direct: <0.1 mg/dL (ref 0.00–0.40)
Total Protein: 6.9 g/dL (ref 6.0–8.5)

## 2021-04-01 LAB — LIPID PANEL
Chol/HDL Ratio: 2.3 ratio (ref 0.0–5.0)
Cholesterol, Total: 120 mg/dL (ref 100–199)
HDL: 53 mg/dL (ref >39)
LDL Chol Calc (NIH): 48 mg/dL (ref 0–99)
Triglycerides: 102 mg/dL (ref 0–149)
VLDL Cholesterol Cal: 19 mg/dL (ref 5–40)

## 2021-04-01 LAB — HEPATITIS C ANTIBODY: Hep C Virus Ab: <0.1 s/co ratio (ref 0.0–0.9)

## 2021-04-01 LAB — BMP8+ANION GAP
Anion Gap: 15 mmol/L (ref 10.0–18.0)
BUN/Creatinine Ratio: 16 (ref 10–24)
BUN: 17 mg/dL (ref 8–27)
CO2: 24 mmol/L (ref 20–29)
Calcium: 10.1 mg/dL (ref 8.6–10.2)
Chloride: 104 mmol/L (ref 96–106)
Creatinine, Ser: 1.07 mg/dL (ref 0.76–1.27)
Glucose: 101 mg/dL — ABNORMAL HIGH (ref 70–99)
Potassium: 5.4 mmol/L — ABNORMAL HIGH (ref 3.5–5.2)
Sodium: 143 mmol/L (ref 134–144)
eGFR: 77 mL/min/{1.73_m2} (ref >59)

## 2021-04-01 LAB — TSH: TSH: 1.2 u[IU]/mL (ref 0.450–4.500)

## 2021-04-04 ENCOUNTER — Other Ambulatory Visit (HOSPITAL_COMMUNITY): Payer: Self-pay

## 2021-04-04 ENCOUNTER — Encounter: Payer: Self-pay | Admitting: *Deleted

## 2021-04-04 NOTE — Progress Notes (Signed)
Internal Medicine Clinic Attending  Case discussed with Dr. Katsadouros  At the time of the visit.  We reviewed the resident's history and exam and pertinent patient test results.  I agree with the assessment, diagnosis, and plan of care documented in the resident's note.  

## 2021-04-04 NOTE — Progress Notes (Signed)
Lab results from 03-31-2021 faxed to Dr Terrence Dupont [phone] , 04-04-2021 at 11:47 per Dr Johnney Ou' request.  Maryan Rued, PBT 11:51

## 2021-04-06 ENCOUNTER — Other Ambulatory Visit (HOSPITAL_COMMUNITY): Payer: Self-pay

## 2021-04-06 MED ORDER — SPIRONOLACTONE 25 MG PO TABS
12.5000 mg | ORAL_TABLET | Freq: Every day | ORAL | 2 refills | Status: DC
  Filled 2021-04-06: qty 30, 60d supply, fill #0
  Filled 2021-06-25: qty 15, 30d supply, fill #0
  Filled 2021-07-30 – 2021-08-16 (×2): qty 15, 30d supply, fill #1
  Filled 2021-09-17: qty 15, 30d supply, fill #2
  Filled 2021-10-26: qty 15, 30d supply, fill #3
  Filled 2021-11-24: qty 15, 30d supply, fill #4
  Filled 2021-12-24: qty 15, 30d supply, fill #5

## 2021-04-06 MED ORDER — RIVAROXABAN 20 MG PO TABS
20.0000 mg | ORAL_TABLET | Freq: Every day | ORAL | 3 refills | Status: DC
  Filled 2021-04-06: qty 30, 30d supply, fill #0
  Filled 2021-06-10: qty 30, 30d supply, fill #1
  Filled 2021-07-15: qty 30, 30d supply, fill #2
  Filled 2021-08-29: qty 30, 30d supply, fill #3

## 2021-04-06 NOTE — Addendum Note (Signed)
Addended by: Belva Agee on: 04/06/2021 09:11 AM   Modules accepted: Orders

## 2021-04-07 ENCOUNTER — Telehealth: Payer: Self-pay

## 2021-04-07 NOTE — Telephone Encounter (Signed)
Spoke with pt regarding patient assistance for Eliquis medication. Pt would like application ([employer]- [employer]) mailed to his home and he will return to Internal Medicine

## 2021-04-07 NOTE — Telephone Encounter (Signed)
Called pt back and left message.   I followed up on chart note & patient is transitioning to a different medication. Patient assistance is no longer required.

## 2021-04-15 ENCOUNTER — Ambulatory Visit: Payer: Self-pay | Admitting: Cardiovascular Disease

## 2021-04-19 ENCOUNTER — Other Ambulatory Visit (HOSPITAL_COMMUNITY): Payer: Self-pay

## 2021-04-19 MED ORDER — AMIODARONE HCL 200 MG PO TABS
200.0000 mg | ORAL_TABLET | Freq: Every day | ORAL | 3 refills | Status: DC
  Filled 2021-04-19: qty 30, 30d supply, fill #0
  Filled 2021-05-23: qty 30, 30d supply, fill #1
  Filled 2021-06-17: qty 30, 30d supply, fill #2
  Filled 2021-07-15: qty 30, 30d supply, fill #3

## 2021-04-20 ENCOUNTER — Other Ambulatory Visit (HOSPITAL_COMMUNITY): Payer: Self-pay

## 2021-04-22 ENCOUNTER — Other Ambulatory Visit (HOSPITAL_COMMUNITY): Payer: Self-pay

## 2021-04-25 ENCOUNTER — Other Ambulatory Visit: Payer: Self-pay

## 2021-05-09 ENCOUNTER — Other Ambulatory Visit (HOSPITAL_COMMUNITY): Payer: Self-pay

## 2021-05-10 ENCOUNTER — Other Ambulatory Visit (HOSPITAL_COMMUNITY): Payer: Self-pay

## 2021-05-13 ENCOUNTER — Other Ambulatory Visit (HOSPITAL_COMMUNITY): Payer: Self-pay

## 2021-05-24 ENCOUNTER — Other Ambulatory Visit (HOSPITAL_COMMUNITY): Payer: Self-pay

## 2021-05-30 ENCOUNTER — Other Ambulatory Visit (HOSPITAL_COMMUNITY): Payer: Self-pay

## 2021-05-30 MED ORDER — CARVEDILOL 12.5 MG PO TABS
12.5000 mg | ORAL_TABLET | Freq: Two times a day (BID) | ORAL | 3 refills | Status: DC
  Filled 2021-05-30: qty 180, 90d supply, fill #0
  Filled 2021-06-21: qty 60, 30d supply, fill #0
  Filled 2021-09-17: qty 60, 30d supply, fill #1
  Filled 2021-11-19: qty 60, 30d supply, fill #2
  Filled 2021-12-24: qty 60, 30d supply, fill #3
  Filled 2022-01-20: qty 60, 30d supply, fill #4
  Filled 2022-03-27: qty 60, 30d supply, fill #5

## 2021-05-30 MED ORDER — ENTRESTO 49-51 MG PO TABS
1.0000 | ORAL_TABLET | Freq: Two times a day (BID) | ORAL | 3 refills | Status: DC
  Filled 2021-05-30: qty 60, 30d supply, fill #0

## 2021-06-06 ENCOUNTER — Other Ambulatory Visit (HOSPITAL_COMMUNITY): Payer: Self-pay

## 2021-06-06 MED ORDER — FARXIGA 10 MG PO TABS
10.0000 mg | ORAL_TABLET | Freq: Every morning | ORAL | 3 refills | Status: DC
  Filled 2021-06-06: qty 30, 30d supply, fill #0

## 2021-06-13 ENCOUNTER — Other Ambulatory Visit (HOSPITAL_COMMUNITY): Payer: Self-pay

## 2021-06-18 ENCOUNTER — Other Ambulatory Visit (HOSPITAL_COMMUNITY): Payer: Self-pay

## 2021-06-20 ENCOUNTER — Other Ambulatory Visit (HOSPITAL_COMMUNITY): Payer: Self-pay

## 2021-06-21 ENCOUNTER — Other Ambulatory Visit (HOSPITAL_COMMUNITY): Payer: Self-pay

## 2021-06-27 ENCOUNTER — Other Ambulatory Visit (HOSPITAL_COMMUNITY): Payer: Self-pay

## 2021-07-04 ENCOUNTER — Other Ambulatory Visit (HOSPITAL_COMMUNITY): Payer: Self-pay

## 2021-07-15 ENCOUNTER — Other Ambulatory Visit (HOSPITAL_COMMUNITY): Payer: Self-pay

## 2021-07-23 ENCOUNTER — Other Ambulatory Visit (HOSPITAL_COMMUNITY): Payer: Self-pay

## 2021-07-25 ENCOUNTER — Other Ambulatory Visit (HOSPITAL_COMMUNITY): Payer: Self-pay

## 2021-07-25 MED ORDER — AMIODARONE HCL 200 MG PO TABS
200.0000 mg | ORAL_TABLET | Freq: Every day | ORAL | 3 refills | Status: DC
Start: 2021-07-25 — End: 2022-01-20
  Filled 2021-07-25 – 2021-08-29 (×3): qty 30, 30d supply, fill #0
  Filled 2021-10-08 – 2021-10-25 (×2): qty 30, 30d supply, fill #1
  Filled 2021-11-19: qty 30, 30d supply, fill #2
  Filled 2021-12-24: qty 30, 30d supply, fill #3

## 2021-07-28 ENCOUNTER — Other Ambulatory Visit (HOSPITAL_COMMUNITY): Payer: Self-pay

## 2021-08-01 ENCOUNTER — Other Ambulatory Visit (HOSPITAL_COMMUNITY): Payer: Self-pay

## 2021-08-02 ENCOUNTER — Other Ambulatory Visit (HOSPITAL_COMMUNITY): Payer: Self-pay

## 2021-08-03 ENCOUNTER — Other Ambulatory Visit (HOSPITAL_COMMUNITY): Payer: Self-pay

## 2021-08-08 ENCOUNTER — Other Ambulatory Visit (HOSPITAL_COMMUNITY): Payer: Self-pay

## 2021-08-08 MED ORDER — ATORVASTATIN CALCIUM 40 MG PO TABS
40.0000 mg | ORAL_TABLET | Freq: Every day | ORAL | 3 refills | Status: DC
  Filled 2021-08-08 – 2021-08-16 (×2): qty 30, 30d supply, fill #0
  Filled 2021-09-17: qty 30, 30d supply, fill #1
  Filled 2021-11-19: qty 30, 30d supply, fill #2
  Filled 2021-12-24: qty 30, 30d supply, fill #3

## 2021-08-09 ENCOUNTER — Other Ambulatory Visit (HOSPITAL_COMMUNITY): Payer: Self-pay

## 2021-08-16 ENCOUNTER — Other Ambulatory Visit (HOSPITAL_COMMUNITY): Payer: Self-pay

## 2021-08-16 MED ORDER — LOSARTAN POTASSIUM 100 MG PO TABS
100.0000 mg | ORAL_TABLET | Freq: Every day | ORAL | 3 refills | Status: DC
  Filled 2021-08-16: qty 90, 90d supply, fill #0

## 2021-08-25 ENCOUNTER — Other Ambulatory Visit (HOSPITAL_COMMUNITY): Payer: Self-pay

## 2021-08-29 ENCOUNTER — Other Ambulatory Visit (HOSPITAL_COMMUNITY): Payer: Self-pay

## 2021-08-30 ENCOUNTER — Other Ambulatory Visit (HOSPITAL_COMMUNITY): Payer: Self-pay

## 2021-09-17 ENCOUNTER — Other Ambulatory Visit: Payer: Self-pay | Admitting: Student

## 2021-09-19 ENCOUNTER — Other Ambulatory Visit (HOSPITAL_COMMUNITY): Payer: Self-pay

## 2021-09-20 ENCOUNTER — Other Ambulatory Visit (HOSPITAL_COMMUNITY): Payer: Self-pay

## 2021-09-20 MED ORDER — RIVAROXABAN 20 MG PO TABS
20.0000 mg | ORAL_TABLET | Freq: Every day | ORAL | 3 refills | Status: DC
  Filled 2021-09-20: qty 30, 30d supply, fill #0
  Filled 2021-11-19: qty 30, 30d supply, fill #1
  Filled 2022-02-03: qty 30, 30d supply, fill #2
  Filled 2022-03-12: qty 30, 30d supply, fill #3

## 2021-09-21 ENCOUNTER — Other Ambulatory Visit (HOSPITAL_COMMUNITY): Payer: Self-pay

## 2021-10-10 ENCOUNTER — Other Ambulatory Visit (HOSPITAL_COMMUNITY): Payer: Self-pay

## 2021-10-13 ENCOUNTER — Other Ambulatory Visit (HOSPITAL_COMMUNITY): Payer: Self-pay

## 2021-10-18 ENCOUNTER — Other Ambulatory Visit (HOSPITAL_COMMUNITY): Payer: Self-pay

## 2021-10-25 ENCOUNTER — Other Ambulatory Visit (HOSPITAL_COMMUNITY): Payer: Self-pay

## 2021-10-26 ENCOUNTER — Other Ambulatory Visit (HOSPITAL_COMMUNITY): Payer: Self-pay

## 2021-10-30 ENCOUNTER — Other Ambulatory Visit: Payer: Self-pay | Admitting: Student

## 2021-11-14 ENCOUNTER — Other Ambulatory Visit: Payer: Self-pay | Admitting: Student

## 2021-11-14 ENCOUNTER — Other Ambulatory Visit (HOSPITAL_COMMUNITY): Payer: Self-pay

## 2021-11-16 ENCOUNTER — Other Ambulatory Visit: Payer: Self-pay | Admitting: Student

## 2021-11-16 ENCOUNTER — Other Ambulatory Visit (HOSPITAL_COMMUNITY): Payer: Self-pay

## 2021-11-16 DIAGNOSIS — I428 Other cardiomyopathies: Secondary | ICD-10-CM

## 2021-11-16 MED ORDER — DAPAGLIFLOZIN PROPANEDIOL 10 MG PO TABS
10.0000 mg | ORAL_TABLET | Freq: Every morning | ORAL | 11 refills | Status: DC
  Filled 2021-11-16: qty 30, 30d supply, fill #0

## 2021-11-18 ENCOUNTER — Other Ambulatory Visit (HOSPITAL_COMMUNITY): Payer: Self-pay

## 2021-11-18 MED ORDER — FARXIGA 10 MG PO TABS
10.0000 mg | ORAL_TABLET | Freq: Every morning | ORAL | 3 refills | Status: DC
  Filled 2021-11-18: qty 30, 30d supply, fill #0

## 2021-11-19 ENCOUNTER — Other Ambulatory Visit: Payer: Self-pay | Admitting: Student

## 2021-11-21 ENCOUNTER — Other Ambulatory Visit (HOSPITAL_COMMUNITY): Payer: Self-pay

## 2021-11-22 ENCOUNTER — Other Ambulatory Visit (HOSPITAL_COMMUNITY): Payer: Self-pay

## 2021-11-22 MED ORDER — FARXIGA 10 MG PO TABS: 10.0000 mg | ORAL_TABLET | Freq: Every day | ORAL | 3 refills | Status: DC

## 2021-11-25 ENCOUNTER — Other Ambulatory Visit (HOSPITAL_COMMUNITY): Payer: Self-pay

## 2021-11-25 MED ORDER — FUROSEMIDE 40 MG PO TABS
40.0000 mg | ORAL_TABLET | Freq: Every day | ORAL | 3 refills | Status: DC
  Filled 2021-11-25: qty 90, 90d supply, fill #0

## 2021-12-05 ENCOUNTER — Other Ambulatory Visit (HOSPITAL_COMMUNITY): Payer: Self-pay

## 2021-12-08 ENCOUNTER — Ambulatory Visit: Payer: Medicare HMO | Admitting: Cardiovascular Disease

## 2021-12-08 ENCOUNTER — Encounter: Payer: Self-pay | Admitting: Cardiovascular Disease

## 2021-12-08 VITALS — BP 116/84 | HR 69 | Ht 68.0 in | Wt 261.0 lb

## 2021-12-08 DIAGNOSIS — I2510 Atherosclerotic heart disease of native coronary artery without angina pectoris: Secondary | ICD-10-CM | POA: Diagnosis not present

## 2021-12-08 DIAGNOSIS — I10 Essential (primary) hypertension: Secondary | ICD-10-CM | POA: Diagnosis not present

## 2021-12-08 DIAGNOSIS — I4892 Unspecified atrial flutter: Secondary | ICD-10-CM

## 2021-12-08 DIAGNOSIS — F1491 Cocaine use, unspecified, in remission: Secondary | ICD-10-CM

## 2021-12-08 DIAGNOSIS — I483 Typical atrial flutter: Secondary | ICD-10-CM

## 2021-12-08 DIAGNOSIS — I5042 Chronic combined systolic (congestive) and diastolic (congestive) heart failure: Secondary | ICD-10-CM

## 2021-12-08 DIAGNOSIS — Z01812 Encounter for preprocedural laboratory examination: Secondary | ICD-10-CM

## 2021-12-08 DIAGNOSIS — D6869 Other thrombophilia: Secondary | ICD-10-CM

## 2021-12-08 NOTE — Patient Instructions (Signed)
Medication Instructions:  No Changes In Medications at this time.  *If you need a refill on your cardiac medications before your next appointment, please call your pharmacy*  Lab Work: Please return for Blood Work in 1 WEEK ON JULY 20th. No appointment needed, lab here at the office is open Monday-Friday from 8AM to 4PM and closed daily for lunch from 12:45-1:45.   If you have labs (blood work) drawn today and your tests are completely normal, you will receive your results only by: MyChart Message (if you have MyChart) OR A paper copy in the mail If you have any lab test that is abnormal or we need to change your treatment, we will call you to review the results.  Testing/Procedures: Dear [name] are scheduled for a Cardioversion on JULY 26th  with Dr. [name].  Please arrive at the [facility] [place] (Main Entrance A) at [facility]: [address] [place], [place] [address] at 10:30 am. (1 hour prior to procedure unless lab work is needed; if lab work is needed arrive 1.5 hours ahead)  DIET: Nothing to eat or drink after midnight except a sip of water with medications (see medication instructions below)  FYI: For your safety, and to allow [place] to monitor your vital signs accurately during the surgery/procedure we request that   if you have artificial nails, gel coating, SNS etc. Please have those removed prior to your surgery/procedure. Not having the nail coverings /polish removed may result in cancellation or delay of your surgery/procedure.   Medication Instructions: Hold LASIX- DO NOT TAKE THE MORNING OF PROCEDURE  Continue your anticoagulant: XARELTO You will need to continue your anticoagulant after your procedure until you  are told by your  Provider that it is safe to stop   Labs: Please return for Blood Work in 1 WEEK (JULY 20th). No appointment needed, lab here at the office is open Monday-Friday from 8AM to 4PM and closed daily for lunch from 12:45-1:45.   You  must have a responsible person to drive you home and stay in the waiting area during your procedure. Failure to do so could result in cancellation.  Bring your insurance cards.  *Special Note: Every effort is made to have your procedure done on time. Occasionally there are emergencies that occur at the hospital that may cause delays. Please be patient if a delay does occur.     Follow-Up: At [facility], you and your health needs are our priority.  As part of our continuing mission to provide you with exceptional heart care, we have created designated Provider Care Teams.  These Care Teams include your primary Cardiologist (physician) and Advanced Practice Providers (APPs -  Physician Assistants and Nurse Practitioners) who all work together to provide you with the care you need, when you need it.  We recommend signing up for the patient portal called "MyChart".  Sign up information is provided on this After Visit Summary.  MyChart is used to connect with patients for Virtual Visits (Telemedicine).  Patients are able to view lab/test results, encounter notes, upcoming appointments, etc.  Non-urgent messages can be sent to your provider as well.   To learn more about what you can do with MyChart, go to [email or web address].    Your next appointment:   AUGUST 22nd AT 1:30 PM   The format for your next appointment:   In Person  Provider:   Thurmon Fair, MD

## 2021-12-08 NOTE — Progress Notes (Signed)
Cardiology Office Note:    Date:  12/09/2021   ID:  Roger Humphrey, DOB [birth year], MRN [id number]  PCP:  Marolyn Haller, MD   [facility] Providers Cardiologist:  Thurmon Fair, MD     Referring MD: Marolyn Haller, MD   Chief Complaint  Patient presents with   Atrial Flutter  This is his first appointment in office since July 2019.  History of Present Illness:    Roger Humphrey is a 66 y.o. male returns for follow-up for atrial flutter, for the first time since 2019   He initially presented with acute pulmonary edema that occurred in the setting of hypertensive emergency and new onset atrial flutter in November 2018.  He underwent TEE cardioversion at that time.  LVEF was 30-35%, but medical therapy with beta-blockers and angiotensin receptor blockers was limited due to acute kidney injury and recent ("one-time") cocaine use.  Coronary CT angiogram performed in November 2018 showed evidence of coronary artery disease with an occluded large ramus intermedius, high-grade stenosis in the distal small left circumflex coronary artery (FFR less than 0.5) and moderate diffuse stenosis in the mid LAD artery estimated at maximum 50-60% (FFR 0.75).  A small secundum atrial septal defect was also reported.  He was subsequently lost to follow-up in our practice.    He presented to the emergency room in October 2022 with a rapid irregular heartbeat and severe hypertension.  ECG showed atrial flutter with mostly 3: 1 AV block and a ventricular rate of 97 bpm.  Once again he had recently used cocaine.  Labs are significant for mildly elevated BNP at 324 and minimal elevation in hs troponin at 37-38.  After rate control with a single dose of diltiazem he reportedly converted to sinus rhythm (but all ECG tracings available for review show atrial flutter).  The emergency room physician recommended a CT angiogram to exclude pulmonary embolism but the patient refused and decided to go home.   He was later seen in the internal medicine clinic on 03/31/2021 when he was in an irregular rhythm by exam (ECG not performed).  He was scheduled for an office appointment on 04/15/2021 with [facility] but did not show up for that appointment.    Has been recently seeing Dr. [name], but unfortunately do not have any records from those office visits.  He was appropriately prescribed Entresto, carvedilol, spironolactone, Farxiga, furosemide and amiodarone.  He is supposed to be on Xarelto anticoagulation.  I am not sure if he had any additional imaging studies such as an echocardiogram.  Not sure why he is now seeing [place] in the clinic, instead of Dr. [name], but he tells me that he wants to switch care over to our office.  Patient  He firmly denies any recent use of cocaine.  He has been working as a Education administrator, without much in the way of symptoms until recently.  He became symptomatic again about 2 weeks ago with abrupt onset of dyspnea, palpitations and later dizziness.  Unfortunately he has missed his Xarelto for considerable amount of time until about a week or 2 ago when he finally refilled his prescription.  The pattern of noncompliance apparently extends to many other of the medications that he is prescribed.  For what it is worth his prescription list contains Entresto 49-51 mg twice daily, carvedilol 12.5 mg twice daily, Farxiga 10 mg daily,  spironolactone 12.5 mg once daily and furosemide 40 mg daily in addition to amiodarone 200 mg daily and atorvastatin 40  mg daily.  Past Medical History:  Diagnosis Date   Acute systolic CHF (congestive heart failure) (HCC) 04/24/2017   Atrial flutter, paroxysmal (HCC) 03/2017   Chronic anticoagulation 03/2017   Eliquis   Hypertension     Past Surgical History:  Procedure Laterality Date   FACIAL LACERATION REPAIR  05/2010    Current Medications: Current Meds  Medication Sig   amiodarone (PACERONE) 200 MG tablet Take 1 tablet (200 mg total) by  mouth daily.   Ascorbic Acid (VITAMIN C PO) Take 1 tablet by mouth daily in the afternoon.   aspirin EC 81 MG tablet Take 81 mg by mouth daily. Swallow whole.   atorvastatin (LIPITOR) 40 MG tablet Take 1 tablet (40 mg total) by mouth at bedtime.   carvedilol (COREG) 12.5 MG tablet Take 1 tablet (12.5 mg total) by mouth 2 (two) times daily with food.   Cholecalciferol (VITAMIN D3) 25 MCG (1000 UT) CAPS Take 1 capsule by mouth daily in the afternoon.   dapagliflozin propanediol (FARXIGA) 10 MG TABS tablet Take 1 tablet (10 mg total) by mouth daily before breakfast.   furosemide (LASIX) 40 MG tablet Take 1 tablet (40 mg total) by mouth daily.   losartan (COZAAR) 100 MG tablet Take 1 tablet (100 mg total) by mouth daily.   METAMUCIL FIBER PO Take 1 tablet by mouth daily.   rivaroxaban (XARELTO) 20 MG TABS tablet Take 1 tablet (20 mg total) by mouth daily with supper.   sacubitril-valsartan (ENTRESTO) 49-51 MG Take 1 tablet by mouth 2 (two) times daily.   spironolactone (ALDACTONE) 25 MG tablet Take 0.5 tablets (12.5 mg total) by mouth daily.     Allergies:   Patient has no known allergies.   Social History   Socioeconomic History   Marital status: Married    Spouse name: Not on file   Number of children: Not on file   Years of education: Not on file   Highest education level: Not on file  Occupational History   Not on file  Tobacco Use   Smoking status: Former    Types: Cigarettes    Start date: 05/15/2005    Quit date: 04/16/2017    Years since quitting: 4.6   Smokeless tobacco: Never  Substance and Sexual Activity   Alcohol use: Not on file   Drug use: Yes   Sexual activity: Not on file  Other Topics Concern   Not on file  Social History Narrative   ** Merged History Encounter **       Social Determinants of Health   Financial Resource Strain: Not on file  Food Insecurity: Not on file  Transportation Needs: Not on file  Physical Activity: Not on file  Stress: Not on  file  Social Connections: Not on file     Family History: The patient's family history includes Heart murmur in his father; Hypertension in his father.  ROS:   Please see the history of present illness.     All other systems reviewed and are negative.  EKGs/Labs/Other Studies Reviewed:    The following studies were reviewed today: Coronary CT angiogram 04/27/2017 1.  Small secundum ASD noted. 2. Coronary artery calcium score 990 Agatston units, placing the patient in the 96th percentile for age and gender. This suggests high risk for future cardiac events. 3.  Large ramus occluded proximally. 4. Small AV LCx with moderate stenosis proximally and severe stenosis more distally. This vessel is unlikely to be amenable to PCI. 5.  Nondominant  RCA. 6. Diffuse disease in the LAD, no areas look critical. 50-60% range stenosis in the proximal LAD with up to 50% stenosis in the mid LAD and mild to moderate distal LAD stenosis.  CT FFR:   1. Occluded ramus. 2. FFR < 0.5 in the distal LCx, suggesting hemodynamically significant stenosis. 3. FFR 0.75 in the mid LAD, suggesting hemodynamically significant stenosis.  Echocardiogram 04/26/2017 - Left ventricle: The cavity size was normal. Wall thickness was    increased in a pattern of mild LVH.  LVEF is depressed at approximately 30 to 35% with hypokinesis / akinesis of the anterior and lateral walls. - Mitral valve: There was mild regurgitation.  - Left atrium: The atrium was severely dilated.  - Right atrium: The atrium was mildly dilated.  - Pericardium, extracardiac: A trivial pericardial effusion was    identified.   TEE 04/26/2017  - Left ventricle: The cavity size was mildly dilated. Wall    thickness was increased increased in a pattern of mild to    moderate LVH. Left ventricular geometry showed evidence of    concentric hypertrophy. Systolic function was severely reduced.    The estimated ejection fraction was in the  range of 25% to 30%.    Severe diffuse hypokinesis with regional variations.    Disproportionately severe hypokinesis of the anteroseptal and    anterior myocardium; consistent with ischemia in the distribution    of the left anterior descending coronary artery.  - Left atrium: The atrium was moderately dilated. No evidence of    thrombus in the atrial cavity or appendage. No spontaneous echo    contrast was observed.  - Right ventricle: Systolic function was mildly reduced.  - Right atrium: The atrium was moderately dilated.  - Atrial septum: The septum bowed from left to right, consistent    with increased left atrial pressure. There was a small secundum    atrial septal defect. Doppler showed a moderate left-to-right    atrial level shunt.  - Pericardium, extracardiac: There was a left pleural effusion.   EKG:  EKG is ordered today.  The ekg ordered today demonstrates typical atrial flutter (right atrial counterclockwise), otherwise normal tracing.  Difficult to accurately measure the QTc.  Recent Labs: 02/28/2021: B Natriuretic Peptide 324.1; Hemoglobin 12.5; Platelets 340 03/31/2021: ALT 14; BUN 17; Creatinine, Ser 1.07; Potassium 5.4; Sodium 143; TSH 1.200  Recent Lipid Panel    Component Value Date/Time   CHOL 120 03/31/2021 1019   TRIG 102 03/31/2021 1019   HDL 53 03/31/2021 1019   CHOLHDL 2.3 03/31/2021 1019   LDLCALC 48 03/31/2021 1019     Risk Assessment/Calculations:    CHA2DS2-VASc Score =     This indicates a  % annual risk of stroke. The patient's score is based upon:            Physical Exam:    VS:  BP 116/84 (BP Location: Left Arm, Patient Position: Sitting, Cuff Size: Large)   Pulse 69   Ht 5\' 8"  (1.727 m)   Wt 261 lb (118.4 kg)   SpO2 99%   BMI 39.68 kg/m     Wt Readings from Last 3 Encounters:  12/08/21 261 lb (118.4 kg)  03/31/21 231 lb 14.4 oz (105.2 kg)  02/28/21 212 lb (96.2 kg)     GEN: Appears very muscular and fit but also obese,  well nourished, well developed in no acute distress HEENT: Normal NECK: No JVD; No carotid bruits LYMPHATICS: No lymphadenopathy CARDIAC:  Irregular rhythm, no murmurs, rubs, gallops RESPIRATORY:  Clear to auscultation without rales, wheezing or rhonchi  ABDOMEN: Soft, non-tender, non-distended MUSCULOSKELETAL:  No edema; No deformity  SKIN: Warm and dry NEUROLOGIC:  Alert and oriented x 3 PSYCHIATRIC:  Normal affect   ASSESSMENT:    1. Chronic combined systolic and diastolic CHF (congestive heart failure) (HCC)   2. Typical atrial flutter (HCC)   3. Coronary artery disease involving native coronary artery of native heart without angina pectoris   4. Essential hypertension   5. History of cocaine use   6. Acquired thrombophilia (HCC)   7. Pre-procedure lab exam    PLAN:    In order of problems listed above:  CHF: Has never been clear whether he had only tachycardia related cardiomyopathy or a chronic nonischemic cardiomyopathy due to hypertension and cocaine use with superimposed atrial flutter.  Need to get an updated echocardiogram, although with his recent rhythm problems this may still not clarify the situation.  Should continue treatment with guideline directed medical therapy to include Entresto, carvedilol, spironolactone, Marcelline Deist and an appropriate amount of loop diuretic.  Despite the ongoing arrhythmia he currently appears to be NYHA functional class 1-2 relatively mildly symptomatic.  Clinically he appears to be euvolemic. AFlutter: Some of his notes mention atrial fibrillation but every single tracing reviewed shows typical counterclockwise atrial flutter.  He may be very well suited for cavotricuspid isthmus ablation, but first may need to establish some degree of compliance with medications and follow-up.  We will schedule him for direct-current cardioversion, but only after 3 weeks of uninterrupted anticoagulation.  I impressed upon him the critical importance of  anticoagulation without interruption to prevent post cardioversion stroke. The electrical cardioversion with deep sedation procedure has been fully reviewed with the patient and written informed consent has been obtained. CAD: He has never complained of angina, although he has evidence of occlusion of the ramus intermedius artery and high-grade stenosis in a small left circumflex coronary artery as well as moderate diffuse stenosis in the mid LAD artery on a CT coronary angiogram performed in 2018.  If the coronary anatomy is unchanged, in the absence of symptoms he does not require revascularization.  After we get him back in normal rhythm it may be worthwhile repeating a CT angiogram.  Meanwhile, focus on risk factor modification, including lowering LDL cholesterol to less than 70, avoiding smoking cigarettes and cocaine, maintaining normal blood pressure. HTN: Well-controlled at this time, suggesting he is taking his medications. Cocaine use: He firmly denies any cocaine use in the last 6 months.  He does not intend to resume cocaine use.  We discussed the risk of complications due to interaction between cocaine and heart failure medications.  Carvedilol is a good choice due to combined alpha-beta-blockade in the circumstances. Anticoagulation: Repeatedly stressed the importance of uninterrupted anticoagulation for 3 weeks before and 4 weeks after cardioversion, to avoid stroke.      Shared Decision Making/Informed Consent The risks (stroke, cardiac arrhythmias rarely resulting in the need for a temporary or permanent pacemaker, skin irritation or burns and complications associated with conscious sedation including aspiration, arrhythmia, respiratory failure and death), benefits (restoration of normal sinus rhythm) and alternatives of a direct current cardioversion were explained in detail to Roger Humphrey and he agrees to proceed.      Medication Adjustments/Labs and Tests Ordered: Current medicines  are reviewed at length with the patient today.  Concerns regarding medicines are outlined above.  Orders Placed This Encounter  Procedures   Basic metabolic panel   CBC   EKG 12-Lead   ECHOCARDIOGRAM COMPLETE   No orders of the defined types were placed in this encounter.   Patient Instructions  Medication Instructions:  No Changes In Medications at this time.  *If you need a refill on your cardiac medications before your next appointment, please call your pharmacy*  Lab Work: Please return for Blood Work in 1 WEEK ON JULY 20th. No appointment needed, lab here at the office is open Monday-Friday from 8AM to 4PM and closed daily for lunch from 12:45-1:45.   If you have labs (blood work) drawn today and your tests are completely normal, you will receive your results only by: MyChart Message (if you have MyChart) OR A paper copy in the mail If you have any lab test that is abnormal or we need to change your treatment, we will call you to review the results.  Testing/Procedures: Dear Roger Humphrey are scheduled for a Cardioversion on JULY 26th  with Dr. [name].  Please arrive at the [facility] (Main Entrance A) at [facility]: [address] [place], [place] [address] at 10:30 am. (1 hour prior to procedure unless lab work is needed; if lab work is needed arrive 1.5 hours ahead)  DIET: Nothing to eat or drink after midnight except a sip of water with medications (see medication instructions below)  FYI: For your safety, and to allow [place] to monitor your vital signs accurately during the surgery/procedure we request that   if you have artificial nails, gel coating, SNS etc. Please have those removed prior to your surgery/procedure. Not having the nail coverings /polish removed may result in cancellation or delay of your surgery/procedure.   Medication Instructions: Hold LASIX- DO NOT TAKE THE MORNING OF PROCEDURE  Continue your anticoagulant: XARELTO You will need to  continue your anticoagulant after your procedure until you  are told by your  Provider that it is safe to stop   Labs: Please return for Blood Work in 1 WEEK (JULY 20th). No appointment needed, lab here at the office is open Monday-Friday from 8AM to 4PM and closed daily for lunch from 12:45-1:45.   You must have a responsible person to drive you home and stay in the waiting area during your procedure. Failure to do so could result in cancellation.  Bring your insurance cards.  *Special Note: Every effort is made to have your procedure done on time. Occasionally there are emergencies that occur at the hospital that may cause delays. Please be patient if a delay does occur.     Follow-Up: At [facility], you and your health needs are our priority.  As part of our continuing mission to provide you with exceptional heart care, we have created designated Provider Care Teams.  These Care Teams include your primary Cardiologist (physician) and Advanced Practice Providers (APPs -  Physician Assistants and Nurse Practitioners) who all work together to provide you with the care you need, when you need it.  We recommend signing up for the patient portal called "MyChart".  Sign up information is provided on this After Visit Summary.  MyChart is used to connect with patients for Virtual Visits (Telemedicine).  Patients are able to view lab/test results, encounter notes, upcoming appointments, etc.  Non-urgent messages can be sent to your provider as well.   To learn more about what you can do with MyChart, go to [email or web address].    Your next appointment:  AUGUST 22nd AT 1:30 PM   The format for your next appointment:   In Person  Provider:   Thurmon Fair, MD            Signed, Thurmon Fair, MD  12/09/2021 9:04 PM    [facility]

## 2021-12-09 ENCOUNTER — Encounter: Payer: Self-pay | Admitting: Cardiovascular Disease

## 2021-12-12 ENCOUNTER — Other Ambulatory Visit (HOSPITAL_COMMUNITY): Payer: Self-pay

## 2021-12-12 ENCOUNTER — Telehealth: Payer: Self-pay | Admitting: Cardiovascular Disease

## 2021-12-12 ENCOUNTER — Other Ambulatory Visit: Payer: Self-pay

## 2021-12-12 MED ORDER — DAPAGLIFLOZIN PROPANEDIOL 10 MG PO TABS
10.0000 mg | ORAL_TABLET | Freq: Every day | ORAL | 10 refills | Status: DC
  Filled 2021-12-12: qty 30, 30d supply, fill #0

## 2021-12-12 MED ORDER — ENTRESTO 49-51 MG PO TABS
1.0000 | ORAL_TABLET | Freq: Two times a day (BID) | ORAL | 10 refills | Status: DC
  Filled 2021-12-12: qty 60, 30d supply, fill #0

## 2021-12-12 NOTE — Telephone Encounter (Signed)
*  STAT* If patient is at the pharmacy, call can be transferred to refill team.   1. Which medications need to be refilled? (please list name of each medication and dose if known)   dapagliflozin propanediol (FARXIGA) 10 MG TABS tablet  sacubitril-valsartan (ENTRESTO) 49-51 MG  2. Which pharmacy/location (including street and city if local pharmacy) is medication to be sent to?  [facility]  3. Do they need a 30 day or 90 day supply?  30 day   Patient called stating he is out of both of these medications.    Patient stated he usually gets samples of the [facility] and can stop by the office to collect that medication.

## 2021-12-12 NOTE — Telephone Encounter (Signed)
Pt calling back stating that the pharmacy hasn't received this med refill yet

## 2021-12-14 ENCOUNTER — Encounter (HOSPITAL_COMMUNITY): Payer: Self-pay | Admitting: Cardiovascular Disease

## 2021-12-19 ENCOUNTER — Telehealth: Payer: Self-pay | Admitting: Cardiovascular Disease

## 2021-12-19 NOTE — Telephone Encounter (Signed)
Attempted to call pt. No answer at this time. Left a message for him to return the call.

## 2021-12-19 NOTE — Telephone Encounter (Signed)
Pt would ike a callback regarding scheduled procedure on 12/21/21. Please advise

## 2021-12-20 ENCOUNTER — Other Ambulatory Visit: Payer: Self-pay | Admitting: *Deleted

## 2021-12-20 DIAGNOSIS — I483 Typical atrial flutter: Secondary | ICD-10-CM

## 2021-12-21 ENCOUNTER — Ambulatory Visit (HOSPITAL_COMMUNITY): Admission: RE | Admit: 2021-12-21 | Payer: Medicare HMO | Source: Home / Self Care | Admitting: Cardiovascular Disease

## 2021-12-21 ENCOUNTER — Encounter (HOSPITAL_COMMUNITY): Payer: Self-pay | Admitting: Anesthesiology

## 2021-12-21 SURGERY — CARDIOVERSION
Anesthesia: Monitor Anesthesia Care

## 2021-12-24 ENCOUNTER — Other Ambulatory Visit (HOSPITAL_COMMUNITY): Payer: Self-pay

## 2021-12-26 ENCOUNTER — Other Ambulatory Visit (HOSPITAL_COMMUNITY): Payer: Self-pay

## 2021-12-26 ENCOUNTER — Encounter (HOSPITAL_COMMUNITY): Payer: Self-pay

## 2021-12-26 ENCOUNTER — Other Ambulatory Visit: Payer: Self-pay | Admitting: Student

## 2021-12-26 ENCOUNTER — Ambulatory Visit (HOSPITAL_COMMUNITY): Payer: Medicare HMO | Attending: Cardiology

## 2021-12-26 DIAGNOSIS — I428 Other cardiomyopathies: Secondary | ICD-10-CM

## 2021-12-26 MED ORDER — ENTRESTO 49-51 MG PO TABS
1.0000 | ORAL_TABLET | Freq: Two times a day (BID) | ORAL | 10 refills | Status: DC
  Filled 2021-12-26: qty 60, 30d supply, fill #0
  Filled 2022-02-02: qty 60, 30d supply, fill #1

## 2021-12-26 MED ORDER — DAPAGLIFLOZIN PROPANEDIOL 10 MG PO TABS
10.0000 mg | ORAL_TABLET | Freq: Every day | ORAL | 10 refills | Status: DC
  Filled 2021-12-26 – 2022-11-02 (×6): qty 30, 30d supply, fill #0
  Filled 2022-11-30: qty 30, 30d supply, fill #1

## 2021-12-26 NOTE — Telephone Encounter (Signed)
If he needs an invasive procedure that requires interruption of the anticoagulant, that will impact timing of the cardioversion. He cannot interrupt the anticoagulant 3 weeks before and one month after the cardioversion. Can we get the name of his Urologist, please?

## 2021-12-26 NOTE — Telephone Encounter (Signed)
Called the patient about the canceled cardioversion. He stated that his PCP wanted him  to cancel the cardioversion due to an abnormal urine culture. He stated that protein was found in the urine and has been referred to a Urologist to rule out diabetes or cancer.  The Urologist wants to do a procedure but he was not sure what it was called. He stated that the urologist would be reaching our to Dr. [name] concerning this.   The patient did state that he feels fine and denies any symptoms such as chest pain or shortness of breath.

## 2021-12-26 NOTE — Progress Notes (Signed)
Verified appointment "no show" status with B. [place] at [facility].

## 2021-12-27 ENCOUNTER — Other Ambulatory Visit (HOSPITAL_COMMUNITY): Payer: Self-pay

## 2022-01-02 ENCOUNTER — Other Ambulatory Visit (HOSPITAL_COMMUNITY): Payer: Self-pay

## 2022-01-04 ENCOUNTER — Other Ambulatory Visit (HOSPITAL_COMMUNITY): Payer: Self-pay

## 2022-01-04 ENCOUNTER — Telehealth: Payer: Self-pay

## 2022-01-04 MED ORDER — XARELTO 20 MG PO TABS
20.0000 mg | ORAL_TABLET | Freq: Every day | ORAL | 0 refills | Status: DC
  Filled 2022-01-04: qty 30, 30d supply, fill #0

## 2022-01-04 MED ORDER — ATORVASTATIN CALCIUM 40 MG PO TABS
40.0000 mg | ORAL_TABLET | Freq: Every day | ORAL | 3 refills | Status: AC
  Filled 2022-01-04 – 2022-01-20 (×2): qty 90, 90d supply, fill #0
  Filled 2022-05-15: qty 90, 90d supply, fill #1
  Filled 2022-09-10: qty 90, 90d supply, fill #2
  Filled 2022-11-30: qty 90, 90d supply, fill #3

## 2022-01-04 NOTE — Telephone Encounter (Signed)
Dr. [name], it appears there has been recent communication about this pending clearance request and cancellation of patient's cardioversion. What is your recommendation regarding cardiac evaluation for the procedure.  I am also routing to pharmacy for guidance on holding Xarelto.  Thank you, Levi Aland, NP-C    01/04/2022, 4:48 PM [facility] [address] Office [phone] Fax [phone]

## 2022-01-04 NOTE — Telephone Encounter (Signed)
Patient with diagnosis of A flutter on Xarelto for anticoagulation.    Procedure:  TRUSP Biopsy Date of procedure: TBD   CHA2DS2-VASc Score = 4  This indicates a 4.8% annual risk of stroke. The patient's score is based upon: CHF History: 1 HTN History: 1 Diabetes History: 0 Stroke History: 0 Vascular Disease History: 1 Age Score: 1 Gender Score: 0   CrCl 115 mL/min Platelet count 340K  Per office protocol, patient can hold Xarelto for 3 days prior to procedure.    However he has a possible pending cardioversion and will not be able to hold Xarelto  **This guidance is not considered finalized until pre-operative APP has relayed final recommendations.**

## 2022-01-04 NOTE — Telephone Encounter (Signed)
   Primary Cardiologist: Thurmon Fair, MD  Chart reviewed as part of pre-operative protocol coverage. Given past medical history and time since last visit, based on ACC/AHA guidelines, Roger Humphrey would be at acceptable risk for the planned procedure without further cardiovascular testing.   Per office protocol, patient can hold Xarelto for 3 days prior to procedure.  Patient should be advised to resume anticoagulation as soon as possible after his procedure so that we can schedule cardioversion.   I will route this recommendation to the requesting party via Epic fax function and remove from pre-op pool.  Please call with questions.  Levi Aland, NP-C    01/04/2022, 7:10 PM [facility] [address] Office [phone] Fax [phone]

## 2022-01-04 NOTE — Telephone Encounter (Signed)
   Pre-operative Risk Assessment    Patient Name: Roger Humphrey  DOB: 1955-08-29 MRN: [id number]      Request for Surgical Clearance    Procedure:   TRUSP Biopsy  Date of Surgery:  Clearance TBD                                 Surgeon:  Dr. Kasandra Knudsen, MD Surgeon's Group or Practice Name:  [facility] Phone number:  [phone] Fax number:  [phone]   Type of Clearance Requested:   - Medical    Type of Anesthesia:  Not Indicated   Additional requests/questions:   Hold Xarelto for 3 days prior to surgery.  Signed, Irena Cords Maikel Neisler   01/04/2022, 1:38 PM

## 2022-01-04 NOTE — Telephone Encounter (Signed)
OK to hold Xarelto for 3 days before biopsy.  Please set him up for an office appt with me, or AFib clinic to reschedule DC cardioversion. Make the appt 3 weeks after the biopsy please.

## 2022-01-05 NOTE — Telephone Encounter (Signed)
Patient already has a follow up on 8/22 with Dr. [name]

## 2022-01-09 ENCOUNTER — Other Ambulatory Visit (HOSPITAL_COMMUNITY): Payer: Self-pay

## 2022-01-09 MED ORDER — AMOXICILLIN 500 MG PO CAPS
1000.0000 mg | ORAL_CAPSULE | Freq: Two times a day (BID) | ORAL | 0 refills | Status: DC
Start: 2022-01-09 — End: 2022-01-09
  Filled 2022-01-09: qty 56, 14d supply, fill #0

## 2022-01-09 MED ORDER — TETRACYCLINE HCL 500 MG PO CAPS
500.0000 mg | ORAL_CAPSULE | Freq: Four times a day (QID) | ORAL | 0 refills | Status: DC
  Filled 2022-01-09 (×3): qty 56, 14d supply, fill #0

## 2022-01-09 MED ORDER — PANTOPRAZOLE SODIUM 40 MG PO TBEC
40.0000 mg | DELAYED_RELEASE_TABLET | Freq: Two times a day (BID) | ORAL | 0 refills | Status: AC
  Filled 2022-01-09: qty 28, 14d supply, fill #0

## 2022-01-09 MED ORDER — METRONIDAZOLE 250 MG PO TABS
250.0000 mg | ORAL_TABLET | Freq: Four times a day (QID) | ORAL | 0 refills | Status: DC
  Filled 2022-01-09: qty 56, 14d supply, fill #0

## 2022-01-09 MED ORDER — CLARITHROMYCIN 500 MG PO TABS
500.0000 mg | ORAL_TABLET | Freq: Two times a day (BID) | ORAL | 0 refills | Status: DC
  Filled 2022-01-09: qty 28, 14d supply, fill #0

## 2022-01-09 NOTE — Telephone Encounter (Signed)
Gina from [facility] states they have not received the clearance and requests it be sent again.

## 2022-01-10 ENCOUNTER — Encounter (HOSPITAL_COMMUNITY): Payer: Self-pay | Admitting: Cardiovascular Disease

## 2022-01-10 ENCOUNTER — Other Ambulatory Visit (HOSPITAL_COMMUNITY): Payer: Self-pay

## 2022-01-10 ENCOUNTER — Ambulatory Visit (HOSPITAL_COMMUNITY): Payer: Medicare HMO | Attending: Cardiovascular Disease

## 2022-01-10 ENCOUNTER — Encounter (HOSPITAL_COMMUNITY): Payer: Self-pay

## 2022-01-10 NOTE — Progress Notes (Signed)
Verified appointment "no show" status with A. Bennett at 13:04.

## 2022-01-10 NOTE — Telephone Encounter (Signed)
   Patient Name: Roger Humphrey  DOB: Mar 09, 1956 MRN: [id number]  Primary Cardiologist: Thurmon Fair, MD  Chart reviewed as part of pre-operative protocol coverage.  Patient was previously cleared for procedure 01/04/2022. Per clearance notes, patient may hold Xarelto for 3 days prior to procedure.  Please resume Xarelto as soon as possible postprocedure, at the discretion of the surgeon.  I will route this recommendation to the requesting party via Epic fax function and remove from pre-op pool.  Please call with questions.  Joylene Grapes, NP 01/10/2022, 3:57 PM

## 2022-01-17 ENCOUNTER — Other Ambulatory Visit (HOSPITAL_COMMUNITY): Payer: Self-pay

## 2022-01-17 ENCOUNTER — Ambulatory Visit: Payer: Medicare HMO | Admitting: Cardiovascular Disease

## 2022-01-17 MED ORDER — DOXYCYCLINE HYCLATE 100 MG PO CAPS
100.0000 mg | ORAL_CAPSULE | Freq: Two times a day (BID) | ORAL | 0 refills | Status: DC
  Filled 2022-01-17: qty 28, 14d supply, fill #0

## 2022-01-20 ENCOUNTER — Other Ambulatory Visit: Payer: Self-pay | Admitting: Student

## 2022-01-20 ENCOUNTER — Other Ambulatory Visit (HOSPITAL_COMMUNITY): Payer: Self-pay

## 2022-01-20 MED ORDER — AMIODARONE HCL 200 MG PO TABS
200.0000 mg | ORAL_TABLET | Freq: Every day | ORAL | 0 refills | Status: DC
  Filled 2022-01-20: qty 30, 30d supply, fill #0

## 2022-01-25 ENCOUNTER — Other Ambulatory Visit (HOSPITAL_COMMUNITY): Payer: Self-pay

## 2022-01-25 MED ORDER — SPIRONOLACTONE 25 MG PO TABS
12.5000 mg | ORAL_TABLET | Freq: Every day | ORAL | 3 refills | Status: DC
  Filled 2022-01-25: qty 45, 90d supply, fill #0
  Filled 2022-05-16: qty 45, 90d supply, fill #1
  Filled 2022-08-05: qty 45, 90d supply, fill #2
  Filled 2022-09-10 – 2022-11-30 (×2): qty 45, 90d supply, fill #3

## 2022-01-26 ENCOUNTER — Telehealth: Payer: Self-pay | Admitting: Cardiovascular Disease

## 2022-01-26 ENCOUNTER — Other Ambulatory Visit (HOSPITAL_COMMUNITY): Payer: Self-pay

## 2022-01-26 NOTE — Telephone Encounter (Signed)
I did not need this encounter. °

## 2022-01-31 ENCOUNTER — Ambulatory Visit: Payer: Medicare HMO | Attending: Cardiovascular Disease | Admitting: Physician Assistant

## 2022-01-31 ENCOUNTER — Encounter: Payer: Self-pay | Admitting: Physician Assistant

## 2022-01-31 VITALS — BP 112/72 | HR 76 | Ht 68.0 in | Wt 263.4 lb

## 2022-01-31 DIAGNOSIS — I2510 Atherosclerotic heart disease of native coronary artery without angina pectoris: Secondary | ICD-10-CM | POA: Diagnosis not present

## 2022-01-31 DIAGNOSIS — I10 Essential (primary) hypertension: Secondary | ICD-10-CM | POA: Diagnosis not present

## 2022-01-31 DIAGNOSIS — I5042 Chronic combined systolic (congestive) and diastolic (congestive) heart failure: Secondary | ICD-10-CM | POA: Diagnosis not present

## 2022-01-31 DIAGNOSIS — I483 Typical atrial flutter: Secondary | ICD-10-CM

## 2022-01-31 NOTE — Progress Notes (Signed)
Cardiology Office Note:    Date:  01/31/2022   ID:  Perkins Molina, DOB Oct 13, 1955, MRN [id number]  PCP:  Marolyn Haller, MD  [facility] Cardiologist:  Thurmon Fair, MD  [facility] Electrophysiologist:  None   Chief Complaint: AFib/flutter  folow up   History of Present Illness:    Roger Humphrey is a 66 y.o. 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  He initially presented with acute pulmonary edema that occurred in the setting of hypertensive emergency and new onset atrial flutter in November 2018.  He underwent TEE cardioversion at that time.  LVEF was 30-35%, but medical therapy with beta-blockers and angiotensin receptor blockers was limited due to acute kidney injury and recent ("one-time") cocaine use.  Coronary CT angiogram performed in November 2018 showed evidence of coronary artery disease with an occluded large ramus intermedius, high-grade stenosis in the distal small left circumflex coronary artery (FFR less than 0.5) and moderate diffuse stenosis in the mid LAD artery estimated at maximum 50-60% (FFR 0.75).  A small secundum atrial septal defect was also reported.  He was subsequently lost to follow-up in our practice.     He presented to the emergency room in October 2022 with a rapid irregular heartbeat and severe hypertension.  ECG showed atrial flutter with mostly 3: 1 AV block and a ventricular rate of 97 bpm.  Once again he had recently used cocaine.  Labs are significant for mildly elevated BNP at 324 and minimal elevation in hs troponin at 37-38.  After rate control with a single dose of diltiazem he reportedly converted to sinus rhythm (but all ECG tracings available for review show atrial flutter).  The emergency room physician recommended a CT angiogram to exclude pulmonary embolism but the patient refused  and decided to go home.  He was later seen in the internal medicine clinic on 03/31/2021 when he was in an irregular rhythm by exam (ECG not performed).  He was scheduled for an office appointment on 04/15/2021 with [facility] but did not show up for that appointment.    Last seen by Dr. [name] 12/08/2021.  Remain in atrial flutter.  Pending echocardiogram.  Patient is scheduled to have TRUSP biopsy.  Cleared to hold Xarelto for 3 days.  Recommended cardioversion after restarting Xarelto.  Here today for follow-up.  He missed 2 echocardiogram appointment and 1 appointment with Dr. [name].  Today he presented for medication refill.  He has not had his biopsy done yet.  He does not remember why it was being done.  Had some issue with urine.  He denies chest pain, shortness of breath, orthopnea, PND, syncope, lower extremity edema or melena.  Reports compliance with medication.   Past Medical History:  Diagnosis Date   Acute systolic CHF (congestive heart failure) (HCC) 04/24/2017   Atrial flutter, paroxysmal (HCC) 03/2017   Chronic anticoagulation 03/2017   Eliquis   Hypertension     Past Surgical History:  Procedure Laterality Date   FACIAL LACERATION REPAIR  05/2010    Current Medications: Current Meds  Medication Sig   amiodarone (PACERONE) 200 MG tablet Take 1 tablet (200 mg total) by mouth daily.   Ascorbic Acid (VITAMIN C PO) Take 1 tablet by mouth daily in the afternoon.   aspirin EC 81 MG tablet Take 81 mg by mouth daily. Swallow whole.   atorvastatin (LIPITOR)  40 MG tablet Take 1 tablet (40 mg total) by mouth at bedtime.   carvedilol (COREG) 12.5 MG tablet Take 1 tablet (12.5 mg total) by mouth 2 (two) times daily with food.   Cholecalciferol (VITAMIN D3) 25 MCG (1000 UT) CAPS Take 1 capsule by mouth daily in the afternoon.   dapagliflozin propanediol (FARXIGA) 10 MG TABS tablet Take 1 tablet (10 mg total) by mouth daily before breakfast.   furosemide (LASIX) 40 MG  tablet Take 1 tablet (40 mg total) by mouth daily.   losartan (COZAAR) 100 MG tablet Take 1 tablet (100 mg total) by mouth daily.   METAMUCIL FIBER PO Take 1 tablet by mouth daily.   metroNIDAZOLE (FLAGYL) 250 MG tablet Take 1 tablet (250 mg total) by mouth 4 (four) times daily for 14 days.   pantoprazole (PROTONIX) 40 MG tablet Take 1 tablet (40 mg total) by mouth 2 (two) times daily.   psyllium (REGULOID) 0.52 g capsule Take 0.52 g by mouth daily.   rivaroxaban (XARELTO) 20 MG TABS tablet Take 1 tablet (20 mg total) by mouth daily with supper.   sacubitril-valsartan (ENTRESTO) 49-51 MG Take 1 tablet by mouth 2 (two) times daily.   spironolactone (ALDACTONE) 25 MG tablet Take 0.5 tablets (12.5 mg total) by mouth daily.   tetracycline (SUMYCIN) 500 MG capsule Take 500 mg by mouth 4 (four) times daily.     Allergies:   Patient has no known allergies.   Social History   Socioeconomic History   Marital status: Married    Spouse name: Not on file   Number of children: Not on file   Years of education: Not on file   Highest education level: Not on file  Occupational History   Not on file  Tobacco Use   Smoking status: Former    Types: Cigarettes    Start date: 05/15/2005    Quit date: 04/16/2017    Years since quitting: 4.7   Smokeless tobacco: Never  Substance and Sexual Activity   Alcohol use: Not on file   Drug use: Yes   Sexual activity: Not on file  Other Topics Concern   Not on file  Social History Narrative   ** Merged History Encounter **       Social Determinants of Health   Financial Resource Strain: Not on file  Food Insecurity: Not on file  Transportation Needs: Not on file  Physical Activity: Not on file  Stress: Not on file  Social Connections: Not on file     Family History: The patient's family history includes Heart murmur in his father; Hypertension in his father.    ROS:   Please see the history of present illness.    All other systems reviewed  and are negative.   EKGs/Labs/Other Studies Reviewed:    The following studies were reviewed today Echo 04-08-2017 Study Conclusions   - Left ventricle: The cavity size was normal. Wall thickness was    increased in a pattern of mild LVH.  - Mitral valve: There was mild regurgitation.  - Left atrium: The atrium was severely dilated.  - Right atrium: The atrium was mildly dilated.  - Pericardium, extracardiac: A trivial pericardial effusion was    identified.    Coronary CT Apr 08, 2017 ADDENDUM: CT FFR:   1. Occluded ramus.   2. FFR < 0.5 in the distal LCx, suggesting hemodynamically significant stenosis.   3. FFR 0.75 in the mid LAD, suggesting hemodynamically significant stenosis.  EKG:  EKG is  not ordered today.  T Recent Labs: 02/28/2021: B Natriuretic Peptide 324.1; Hemoglobin 12.5; Platelets 340 03/31/2021: ALT 14; BUN 17; Creatinine, Ser 1.07; Potassium 5.4; Sodium 143; TSH 1.200  Recent Lipid Panel    Component Value Date/Time   CHOL 120 03/31/2021 1019   TRIG 102 03/31/2021 1019   HDL 53 03/31/2021 1019   CHOLHDL 2.3 03/31/2021 1019   LDLCALC 48 03/31/2021 1019     Risk Assessment/Calculations:    CHA2DS2-VASc Score = 4   This indicates a 4.8% annual risk of stroke. The patient's score is based upon: CHF History: 1 HTN History: 1 Diabetes History: 0 Stroke History: 0 Vascular Disease History: 1 Age Score: 1 Gender Score: 0   Physical Exam:    VS:  BP 112/72   Pulse 76   Ht 5\' 8"  (1.727 m)   Wt 263 lb 6.4 oz (119.5 kg)   SpO2 96%   BMI 40.05 kg/m     Wt Readings from Last 3 Encounters:  01/31/22 263 lb 6.4 oz (119.5 kg)  12/08/21 261 lb (118.4 kg)  03/31/21 231 lb 14.4 oz (105.2 kg)     GEN:  Well nourished, well developed in no acute distress HEENT: Normal NECK: No JVD; No carotid bruits LYMPHATICS: No lymphadenopathy CARDIAC: Irregular, no murmurs, rubs, gallops RESPIRATORY:  Clear to auscultation without rales, wheezing or rhonchi   ABDOMEN: Soft, non-tender, non-distended MUSCULOSKELETAL:  No edema; No deformity  SKIN: Warm and dry NEUROLOGIC:  Alert and oriented x 3 PSYCHIATRIC:  Normal affect   ASSESSMENT AND PLAN:    Chronic combined CHF Discussed need of echocardiogram.  Will reschedule.  Hopefully he will follow-up.  Continue carvedilol, Farxiga, Lasix, spironolactone and Entresto.  2. CAD - No angina. Continue ASA, statin and BB.   3. Atrial flutter -Plan to schedule cardioversion after his biopsy.  He will call his urologist later today to figure out plan.  He does not remember why he is having biopsy.  Continue Xarelto, amiodarone and carvedilol.  4.  Hyperlipidemia -Continue statin - 03/31/2021: Cholesterol, Total 120; HDL 53; LDL Chol Calc (NIH) 48; Triglycerides 102   5. HTN - BP controlled on current medications.   Medication Adjustments/Labs and Tests Ordered: Current medicines are reviewed at length with the patient today.  Concerns regarding medicines are outlined above.  No orders of the defined types were placed in this encounter.  No orders of the defined types were placed in this encounter.   Patient Instructions  Medication Instructions:  Your physician recommends that you continue on your current medications as directed. Please refer to the Current Medication list given to you today. *If you need a refill on your cardiac medications before your next appointment, please call your pharmacy*   Lab Work: None Ordered   Testing/Procedures: Schedule echocardiogram   Follow-Up: At [facility], you and your health needs are our priority.  As part of our continuing mission to provide you with exceptional heart care, we have created designated Provider Care Teams.  These Care Teams include your primary Cardiologist (physician) and Advanced Practice Providers (APPs -  Physician Assistants and Nurse Practitioners) who all work together to provide you with the care you need,  when you need it.  We recommend signing up for the patient portal called "MyChart".  Sign up information is provided on this After Visit Summary.  MyChart is used to connect with patients for Virtual Visits (Telemedicine).  Patients are able to view lab/test results, encounter notes, upcoming  appointments, etc.  Non-urgent messages can be sent to your provider as well.   To learn more about what you can do with MyChart, go to [email or web address].    Your next appointment:   2 month(s)  The format for your next appointment:   In Person  Provider:   Azalee Course, PA-C     Other Instructions   Important Information About Sugar         Signed, Manson Passey, PA  01/31/2022 9:05 AM    [facility]

## 2022-01-31 NOTE — Patient Instructions (Signed)
Medication Instructions:  Your physician recommends that you continue on your current medications as directed. Please refer to the Current Medication list given to you today. *If you need a refill on your cardiac medications before your next appointment, please call your pharmacy*   Lab Work: None Ordered   Testing/Procedures: Schedule echocardiogram   Follow-Up: At [facility], you and your health needs are our priority.  As part of our continuing mission to provide you with exceptional heart care, we have created designated Provider Care Teams.  These Care Teams include your primary Cardiologist (physician) and Advanced Practice Providers (APPs -  Physician Assistants and Nurse Practitioners) who all work together to provide you with the care you need, when you need it.  We recommend signing up for the patient portal called "MyChart".  Sign up information is provided on this After Visit Summary.  MyChart is used to connect with patients for Virtual Visits (Telemedicine).  Patients are able to view lab/test results, encounter notes, upcoming appointments, etc.  Non-urgent messages can be sent to your provider as well.   To learn more about what you can do with MyChart, go to [email or web address].    Your next appointment:   2 month(s)  The format for your next appointment:   In Person  Provider:   Azalee Course, PA-C     Other Instructions   Important Information About Sugar

## 2022-02-02 ENCOUNTER — Other Ambulatory Visit (HOSPITAL_COMMUNITY): Payer: Self-pay

## 2022-02-03 ENCOUNTER — Other Ambulatory Visit (HOSPITAL_COMMUNITY): Payer: Self-pay

## 2022-02-06 ENCOUNTER — Other Ambulatory Visit (HOSPITAL_COMMUNITY): Payer: Self-pay

## 2022-02-06 ENCOUNTER — Telehealth: Payer: Self-pay | Admitting: Cardiovascular Disease

## 2022-02-06 MED ORDER — LEVOFLOXACIN 750 MG PO TABS
750.0000 mg | ORAL_TABLET | Freq: Every day | ORAL | 0 refills | Status: DC
  Filled 2022-02-06: qty 1, 1d supply, fill #0

## 2022-02-06 NOTE — Telephone Encounter (Signed)
Caller called to report the patient's biopsy is now scheduled for 02/24/22.

## 2022-02-06 NOTE — Telephone Encounter (Signed)
   Patient Name: Roger Humphrey  DOB: 06-17-1955 MRN: [id number]  Primary Cardiologist: Thurmon Fair, MD  Chart reviewed as part of pre-operative protocol coverage. Patient was doing well when seen by me last week. Given past medical history and time since last visit, based on ACC/AHA guidelines, Roger Humphrey would be at acceptable risk for the planned procedure without further cardiovascular testing.  Okay to hold Xarelto for 3 days.  I will route this recommendation to the requesting party via Epic fax function and remove from pre-op pool.  Please call with questions.  [place], [place] 02/06/2022, 3:06 PM

## 2022-02-06 NOTE — Telephone Encounter (Signed)
I will forward this to Chelsea Aus, [facility] who recently saw the pt for pre op clearance.

## 2022-02-09 LAB — COLOGUARD: COLOGUARD: NEGATIVE

## 2022-02-14 ENCOUNTER — Ambulatory Visit (HOSPITAL_COMMUNITY): Payer: Medicare HMO

## 2022-02-15 ENCOUNTER — Other Ambulatory Visit (HOSPITAL_COMMUNITY): Payer: Self-pay

## 2022-02-22 ENCOUNTER — Ambulatory Visit: Payer: Medicare HMO | Admitting: Physician Assistant

## 2022-02-27 ENCOUNTER — Ambulatory Visit (HOSPITAL_COMMUNITY): Payer: Medicare HMO | Attending: Cardiovascular Disease

## 2022-02-27 DIAGNOSIS — I483 Typical atrial flutter: Secondary | ICD-10-CM | POA: Insufficient documentation

## 2022-02-27 LAB — ECHOCARDIOGRAM COMPLETE
Area-P 1/2: 5.39 cm2
P 1/2 time: 760 msec
S' Lateral: 4.2 cm

## 2022-03-06 ENCOUNTER — Other Ambulatory Visit (HOSPITAL_COMMUNITY): Payer: Self-pay

## 2022-03-06 ENCOUNTER — Telehealth: Payer: Self-pay | Admitting: Cardiovascular Disease

## 2022-03-06 MED ORDER — AMIODARONE HCL 200 MG PO TABS
200.0000 mg | ORAL_TABLET | Freq: Every day | ORAL | 0 refills | Status: DC
  Filled 2022-03-06: qty 30, 30d supply, fill #0

## 2022-03-06 MED ORDER — ENTRESTO 49-51 MG PO TABS
1.0000 | ORAL_TABLET | Freq: Two times a day (BID) | ORAL | 1 refills | Status: DC
  Filled 2022-03-06: qty 60, 30d supply, fill #0
  Filled 2022-04-18: qty 60, 30d supply, fill #1

## 2022-03-06 NOTE — Telephone Encounter (Signed)
Patient has been made aware that these have been refilled for him.

## 2022-03-06 NOTE — Telephone Encounter (Signed)
*  STAT* If patient is at the pharmacy, call can be transferred to refill team.   1. Which medications need to be refilled? (please list name of each medication and dose if known) amiodarone (PACERONE) 200 MG tablet sacubitril-valsartan (ENTRESTO) 49-51 MG  2. Which pharmacy/location (including street and city if local pharmacy) is medication to be sent to? [place]  3. Do they need a 30 day or 90 day supply? 30  Pt is completely out of this medication.

## 2022-03-13 ENCOUNTER — Other Ambulatory Visit (HOSPITAL_COMMUNITY): Payer: Self-pay

## 2022-03-14 ENCOUNTER — Other Ambulatory Visit (HOSPITAL_COMMUNITY): Payer: Self-pay

## 2022-03-20 ENCOUNTER — Other Ambulatory Visit (HOSPITAL_COMMUNITY): Payer: Self-pay

## 2022-03-27 ENCOUNTER — Other Ambulatory Visit: Payer: Self-pay | Admitting: Cardiovascular Disease

## 2022-03-27 ENCOUNTER — Other Ambulatory Visit (HOSPITAL_COMMUNITY): Payer: Self-pay

## 2022-03-27 DIAGNOSIS — I4892 Unspecified atrial flutter: Secondary | ICD-10-CM

## 2022-03-28 ENCOUNTER — Other Ambulatory Visit (HOSPITAL_COMMUNITY): Payer: Self-pay

## 2022-03-28 MED FILL — Rivaroxaban Tab 20 MG: ORAL | 30 days supply | Qty: 30 | Fill #0 | Status: CN

## 2022-03-28 MED FILL — Amiodarone HCl Tab 200 MG: ORAL | 30 days supply | Qty: 30 | Fill #0 | Status: CN

## 2022-03-28 NOTE — Telephone Encounter (Signed)
Prescription refill request for Xarelto received.  Indication: Aflutter Last office visit: 01/31/22 (Bhagat)  Weight: 119.5kg Age: 66 Scr: 1.07 (03/31/21)  CrCl: 116.28ml/min  Appropriate dose and refill sent to requested pharmacy.

## 2022-03-30 ENCOUNTER — Other Ambulatory Visit (HOSPITAL_COMMUNITY): Payer: Self-pay

## 2022-03-31 ENCOUNTER — Other Ambulatory Visit (HOSPITAL_COMMUNITY): Payer: Self-pay

## 2022-04-03 ENCOUNTER — Other Ambulatory Visit (HOSPITAL_COMMUNITY): Payer: Self-pay

## 2022-04-03 ENCOUNTER — Other Ambulatory Visit: Payer: Self-pay | Admitting: Physician Assistant

## 2022-04-03 ENCOUNTER — Encounter: Payer: Self-pay | Admitting: Physician Assistant

## 2022-04-03 ENCOUNTER — Ambulatory Visit: Payer: Medicare HMO | Attending: Physician Assistant | Admitting: Physician Assistant

## 2022-04-03 VITALS — BP 126/72 | HR 68 | Ht 71.0 in | Wt 267.6 lb

## 2022-04-03 DIAGNOSIS — I2510 Atherosclerotic heart disease of native coronary artery without angina pectoris: Secondary | ICD-10-CM | POA: Diagnosis not present

## 2022-04-03 DIAGNOSIS — I483 Typical atrial flutter: Secondary | ICD-10-CM

## 2022-04-03 DIAGNOSIS — Z01810 Encounter for preprocedural cardiovascular examination: Secondary | ICD-10-CM

## 2022-04-03 DIAGNOSIS — I10 Essential (primary) hypertension: Secondary | ICD-10-CM | POA: Diagnosis not present

## 2022-04-03 DIAGNOSIS — Z79899 Other long term (current) drug therapy: Secondary | ICD-10-CM | POA: Diagnosis not present

## 2022-04-03 DIAGNOSIS — Z91199 Patient's noncompliance with other medical treatment and regimen due to unspecified reason: Secondary | ICD-10-CM

## 2022-04-03 DIAGNOSIS — I5022 Chronic systolic (congestive) heart failure: Secondary | ICD-10-CM

## 2022-04-03 MED ORDER — AMIODARONE HCL 200 MG PO TABS
200.0000 mg | ORAL_TABLET | Freq: Every day | ORAL | 0 refills | Status: DC
  Filled 2022-04-03: qty 30, 30d supply, fill #0

## 2022-04-03 MED ORDER — CARVEDILOL 12.5 MG PO TABS
12.5000 mg | ORAL_TABLET | Freq: Two times a day (BID) | ORAL | 3 refills | Status: DC
  Filled 2022-04-03: qty 180, 90d supply, fill #0

## 2022-04-03 MED ORDER — FUROSEMIDE 40 MG PO TABS: 40.0000 mg | ORAL_TABLET | Freq: Every day | ORAL | 2 refills | Status: DC

## 2022-04-03 MED ORDER — FUROSEMIDE 40 MG PO TABS
40.0000 mg | ORAL_TABLET | Freq: Every day | ORAL | 2 refills | Status: AC
  Filled 2022-04-03 (×2): qty 90, 90d supply, fill #0

## 2022-04-03 MED ORDER — AMIODARONE HCL 200 MG PO TABS
200.0000 mg | ORAL_TABLET | Freq: Every day | ORAL | 1 refills | Status: DC
  Filled 2022-04-03 – 2022-04-18 (×2): qty 90, 90d supply, fill #0
  Filled 2022-07-07: qty 90, 90d supply, fill #1

## 2022-04-03 NOTE — Patient Instructions (Addendum)
Medication Instructions:  Your physician recommends that you continue on your current medications as directed. Please refer to the Current Medication list given to you today.  *If you need a refill on your cardiac medications before your next appointment, please call your pharmacy*  Lab Work: Your physician recommends that you return for lab work in 2 weeks:  BMP  If you have labs (blood work) drawn today and your tests are completely normal, you will receive your results only by: [place] (if you have MyChart) OR A paper copy in the mail If you have any lab test that is abnormal or we need to change your treatment, we will call you to review the results.   Testing/Procedures: NONE ordered at this time of appointment   Follow-Up: At [facility], you and your health needs are our priority.  As part of our continuing mission to provide you with exceptional heart care, we have created designated Provider Care Teams.  These Care Teams include your primary Cardiologist (physician) and Advanced Practice Providers (APPs -  Physician Assistants and Nurse Practitioners) who all work together to provide you with the care you need, when you need it.  We recommend signing up for the patient portal called "MyChart".  Sign up information is provided on this After Visit Summary.  MyChart is used to connect with patients for Virtual Visits (Telemedicine).  Patients are able to view lab/test results, encounter notes, upcoming appointments, etc.  Non-urgent messages can be sent to your provider as well.   To learn more about what you can do with MyChart, go to [email or web address].    Your next appointment:   2 month(s)  The format for your next appointment:   In Person  Provider:   APP on day Dr. Sallyanne Kuster is in office        Other Instructions Call your Urologist office-Dr. Jacalyn Lefevre to reschedule Prostate Biopsy at [phone]   Important Information About  Sugar

## 2022-04-03 NOTE — Progress Notes (Unsigned)
Cardiology Office Note:    Date:  04/05/2022   ID:  Mehul Rudin, DOB 13-May-1956, MRN [id number]  PCP:  Marolyn Haller, MD    [facility] Providers Cardiologist:  Thurmon Fair, MD     Referring MD: Marolyn Haller, MD   Chief Complaint  Patient presents with   Follow-up    Seen for Dr. [name]    History of Present Illness:    Roger Humphrey is a 66 y.o. male with a hx of CAD, paroxysmal atrial flutter on Eliquis, hypertension, and a history of chronic systolic heart failure.  He initially presented with acute pulmonary edema that occurred in the setting of hypertensive emergency and the new onset of atrial flutter in November 2018.  He underwent TEE DCCV at the time.  EF was 30 to 35%, medical therapy with beta-blocker and ARB were limited due to AKI and recent cocaine use.  CTA performed in November 2018 showed evidence of coronary artery disease, occluded large ramus intermedius, high-grade stenosis in distal small left circumflex artery with FFR less than 0.5, moderate diffuse stenosis in mid LAD 50 to 60%, FFR was 0.75, small secundum atrial septal defect was reported.  He subsequently was lost to follow-up from our practice.  He presented back to the emergency room in October 2022 with rapid atrial flutter and severe hypertension.  EKG confirmed atrial flutter with mostly 3-1 AV block.  He was cocaine positive.  BNP was elevated at 324.  Troponin minimally elevated at 37--38.  ED physician recommended CTA to exclude PE, however patient refused and decided to go home.  He was seen by internal medicine service in November 2022 who noted irregular heart beat on exam.  He was scheduled to see cardiology service in November 2022, however did not show up for appointment.  He was recently seen by Dr. [name] and was prescribed Entresto, carvedilol, spironolactone, Farxiga, Lasix and amiodarone.  He was also supposed to be on Xarelto.  Instead of following up with Dr.  [name], he decided to switch office back to our practice.  He was seen by Dr. [name] in July 2023, he was probe to GDMT, the biggest concern at the time was still noncompliance.  He did deny any usage of cocaine for at least 6 months at the time. Dr. [name] recommended proceed with outpatient cardioversion and echocardiogram, cardioversion was scheduled for 12/21/2021 however later was canceled.  Echo was not done either.  Patient was recently seen by Chauncey Mann PA-C for cardiac clearance and he was cleared to hold Xarelto for 3 days prior to biopsy procedure.  He has been instructed to proceed with echocardiogram afterward.  He also emphasized on completing the echocardiogram. Repeat echocardiogram performed on 02/27/2022 showed EF 45 to 50%, no regional wall motion abnormality, moderate LAE, normal right atrial size, trivial MR, mild AI.   Patient presents today for follow-up.  He says he ran out of amiodarone, Xarelto, furosemide and carvedilol for a month, he has recently gotten a few of those medication that restarted him back.  We will refill his medications.  He will need samples of Xarelto and the medication assistance for Xarelto and Entresto.  Unfortunately, even though we have cleared him for prostate biopsy surgery, he says there was a conflict of time with his father's appointment, therefore he ended up canceling the prostate biopsy.  I urged him to call urologist office today to reschedule his procedure.  Given the need to come off of Xarelto for 3  days for the urology procedure, we will delay his cardioversion for another 2 months.  We will see him in 2 months.  EKG shows he is still in atrial flutter today.  Note, based on previous note by Dr. Sallyanne Kuster, he is thinking about proceeding with cardioversion after his prostate biopsy procedure, assuming he can be compliant with his Xarelto medication.  After cardioversion, we can think about repeating a coronary CT.  Past Medical History:   Diagnosis Date   Acute systolic CHF (congestive heart failure) ([place]) 04/24/2017   Atrial flutter, paroxysmal ([place]) 03/2017   Chronic anticoagulation 03/2017   Eliquis   Hypertension     Past Surgical History:  Procedure Laterality Date   FACIAL LACERATION REPAIR  05/2010    Current Medications: Current Meds  Medication Sig   Ascorbic Acid (VITAMIN C PO) Take 1 tablet by mouth daily in the afternoon.   aspirin EC 81 MG tablet Take 81 mg by mouth daily. Swallow whole.   atorvastatin (LIPITOR) 40 MG tablet Take 1 tablet (40 mg total) by mouth at bedtime.   Cholecalciferol (VITAMIN D3) 25 MCG (1000 UT) CAPS Take 1 capsule by mouth daily in the afternoon.   dapagliflozin propanediol (FARXIGA) 10 MG TABS tablet Take 1 tablet (10 mg total) by mouth daily before breakfast.   METAMUCIL FIBER PO Take 1 tablet by mouth daily.   pantoprazole (PROTONIX) 40 MG tablet Take 1 tablet (40 mg total) by mouth 2 (two) times daily.   psyllium (REGULOID) 0.52 g capsule Take 0.52 g by mouth daily.   rivaroxaban (XARELTO) 20 MG TABS tablet Take 1 tablet (20 mg total) by mouth daily with supper.   sacubitril-valsartan (ENTRESTO) 49-51 MG Take 1 tablet by mouth 2 (two) times daily.   spironolactone (ALDACTONE) 25 MG tablet Take 0.5 tablets (12.5 mg total) by mouth daily.   tetracycline (SUMYCIN) 500 MG capsule Take 500 mg by mouth 4 (four) times daily.   [DISCONTINUED] amiodarone (PACERONE) 200 MG tablet Take 1 tablet (200 mg total) by mouth daily.   [DISCONTINUED] carvedilol (COREG) 12.5 MG tablet Take 1 tablet (12.5 mg total) by mouth 2 (two) times daily with food.   [DISCONTINUED] furosemide (LASIX) 40 MG tablet Take 1 tablet (40 mg total) by mouth daily.   [DISCONTINUED] levofloxacin (LEVAQUIN) 750 MG tablet Take 1 tablet (750 mg total) by mouth daily on the day of the procedure   [DISCONTINUED] losartan (COZAAR) 100 MG tablet Take 1 tablet (100 mg total) by mouth daily.   [DISCONTINUED] metroNIDAZOLE  (FLAGYL) 250 MG tablet Take 1 tablet (250 mg total) by mouth 4 (four) times daily for 14 days.     Allergies:   Patient has no known allergies.   Social History   Socioeconomic History   Marital status: Married    Spouse name: Not on file   Number of children: Not on file   Years of education: Not on file   Highest education level: Not on file  Occupational History   Not on file  Tobacco Use   Smoking status: Former    Types: Cigarettes    Start date: 05/15/2005    Quit date: 04/16/2017    Years since quitting: 4.9   Smokeless tobacco: Never  Substance and Sexual Activity   Alcohol use: Not on file   Drug use: Yes   Sexual activity: Not on file  Other Topics Concern   Not on file  Social History Narrative   ** Merged History Encounter **  Social Determinants of Health   Financial Resource Strain: Not on file  Food Insecurity: Not on file  Transportation Needs: Not on file  Physical Activity: Not on file  Stress: Not on file  Social Connections: Not on file     Family History: The patient's family history includes Heart murmur in his father; Hypertension in his father.  ROS:   Please see the history of present illness.     All other systems reviewed and are negative.  EKGs/Labs/Other Studies Reviewed:    The following studies were reviewed today:  Echo 02/27/2022  1. Posterior lateral hypokinesis . Left ventricular ejection fraction, by  estimation, is 45 to 50%. The left ventricle has mildly decreased  function. The left ventricle has no regional wall motion abnormalities.  The left ventricular internal cavity size   was mildly dilated. Left ventricular diastolic parameters are  indeterminate.   2. Right ventricular systolic function is normal. The right ventricular  size is normal.   3. Left atrial size was moderately dilated.   4. The mitral valve is abnormal. Trivial mitral valve regurgitation. No  evidence of mitral stenosis.   5. The aortic  valve is tricuspid. There is mild calcification of the  aortic valve. There is mild thickening of the aortic valve. Aortic valve  regurgitation is mild. Aortic valve sclerosis is present, with no evidence  of aortic valve stenosis.   6. The inferior vena cava is normal in size with greater than 50%  respiratory variability, suggesting right atrial pressure of 3 mmHg.   EKG:  EKG is ordered today.  The ekg ordered today demonstrates atrial flutter with variable AV block.  Recent Labs: No results found for requested labs within last 365 days.  Recent Lipid Panel    Component Value Date/Time   CHOL 120 03/31/2021 1019   TRIG 102 03/31/2021 1019   HDL 53 03/31/2021 1019   CHOLHDL 2.3 03/31/2021 1019   LDLCALC 48 03/31/2021 1019     Risk Assessment/Calculations:    CHA2DS2-VASc Score = 4   This indicates a 4.8% annual risk of stroke. The patient's score is based upon: CHF History: 1 HTN History: 1 Diabetes History: 0 Stroke History: 0 Vascular Disease History: 1 Age Score: 1 Gender Score: 0          Physical Exam:    VS:  BP 126/72   Pulse 68   Ht 5\' 11"  (1.803 m)   Wt 267 lb 9.6 oz (121.4 kg)   SpO2 99%   BMI 37.32 kg/m        Wt Readings from Last 3 Encounters:  04/03/22 267 lb 9.6 oz (121.4 kg)  01/31/22 263 lb 6.4 oz (119.5 kg)  12/08/21 261 lb (118.4 kg)     GEN:  Well nourished, well developed in no acute distress HEENT: Normal NECK: No JVD; No carotid bruits LYMPHATICS: No lymphadenopathy CARDIAC: RRR, no murmurs, rubs, gallops RESPIRATORY:  Clear to auscultation without rales, wheezing or rhonchi  ABDOMEN: Soft, non-tender, non-distended MUSCULOSKELETAL:  No edema; No deformity  SKIN: Warm and dry NEUROLOGIC:  Alert and oriented x 3 PSYCHIATRIC:  Normal affect   ASSESSMENT:    1. Typical atrial flutter (HCC)   2. Medication management   3. Coronary artery disease involving native coronary artery of native heart without angina pectoris   4.  Primary hypertension   5. Chronic systolic heart failure (HCC)   6. Preop cardiovascular exam   7. Non-compliance with treatment  PLAN:    In order of problems listed above:  Atrial flutter: Plan to proceed with cardioversion after his prostate biopsy procedure.  Unfortunately, given that he was previously scheduled for TEE cardioversion in July, this was canceled.  He says he has run out of all cardiac medication for about a month even though some of the medication clearly has refills left.  I am not sure how compliant he is with his medication at this point.  Again I recommended that he contact his urologist to proceed with prostate biopsy first, we will see the patient back in 2 months and potentially arrange cardioversion +/- TEE  CAD: Denies any chest pain  Hypertension: Blood pressure stable  Chronic systolic heart failure: Over the years, his EF has been as low as 30 to 35% in 2018.  EF improved to 55 to 60% with medical management.  More recently, repeat echocardiogram obtained on 02/27/2022 showed his EF is down to 45 to 50% again.  Again compliance with medication is a big issue  Preoperative clearance: He was previously cleared for prostate biopsy, however he says he has to take care of his father and that there was a scheduling conflict therefore he did not go through with it.  I urged him to contact his urologist and reschedule the prostate biopsy as soon as possible  History of noncompliance: This is a major issue, I am not sure how compliant he is with his medication.  He says he has run out of majority of his cardiac medication for the past month, however some of them clearly has refills left.  He says he has not taken the blood thinner for about a month.  We have sent refills for his medication.          Medication Adjustments/Labs and Tests Ordered: Current medicines are reviewed at length with the patient today.  Concerns regarding medicines are outlined above.  Orders  Placed This Encounter  Procedures   Basic metabolic panel   EKG 12-Lead   Meds ordered this encounter  Medications   DISCONTD: amiodarone (PACERONE) 200 MG tablet    Sig: Take 1 tablet (200 mg total) by mouth daily.    Dispense:  30 tablet    Refill:  0    need appointment ASAP   carvedilol (COREG) 12.5 MG tablet    Sig: Take 1 tablet (12.5 mg total) by mouth 2 (two) times daily with food.    Dispense:  180 tablet    Refill:  3   DISCONTD: furosemide (LASIX) 40 MG tablet    Sig: Take 1 tablet (40 mg total) by mouth daily.    Dispense:  30 tablet    Refill:  2    IM Program   amiodarone (PACERONE) 200 MG tablet    Sig: Take 1 tablet (200 mg total) by mouth daily.    Dispense:  90 tablet    Refill:  1    need appointment ASAP   furosemide (LASIX) 40 MG tablet    Sig: Take 1 tablet (40 mg total) by mouth daily.    Dispense:  90 tablet    Refill:  2    IM Program    Patient Instructions  Medication Instructions:  Your physician recommends that you continue on your current medications as directed. Please refer to the Current Medication list given to you today.  *If you need a refill on your cardiac medications before your next appointment, please call your pharmacy*  Lab Work:  Your physician recommends that you return for lab work in 2 weeks:  BMP  If you have labs (blood work) drawn today and your tests are completely normal, you will receive your results only by: MyChart Message (if you have MyChart) OR A paper copy in the mail If you have any lab test that is abnormal or we need to change your treatment, we will call you to review the results.   Testing/Procedures: NONE ordered at this time of appointment   Follow-Up: At [facility], you and your health needs are our priority.  As part of our continuing mission to provide you with exceptional heart care, we have created designated Provider Care Teams.  These Care Teams include your primary Cardiologist  (physician) and Advanced Practice Providers (APPs -  Physician Assistants and Nurse Practitioners) who all work together to provide you with the care you need, when you need it.  We recommend signing up for the patient portal called "MyChart".  Sign up information is provided on this After Visit Summary.  MyChart is used to connect with patients for Virtual Visits (Telemedicine).  Patients are able to view lab/test results, encounter notes, upcoming appointments, etc.  Non-urgent messages can be sent to your provider as well.   To learn more about what you can do with MyChart, go to [email or web address].    Your next appointment:   2 month(s)  The format for your next appointment:   In Person  Provider:   APP on day Dr. [name] is in office        Other Instructions Call your Urologist office-Dr. Kasandra Knudsen to reschedule Prostate Biopsy at [phone]   Important Information About Sugar         Ramond Dial, [place]  04/05/2022 11:23 AM    [facility]

## 2022-04-05 ENCOUNTER — Encounter: Payer: Self-pay | Admitting: Physician Assistant

## 2022-04-14 ENCOUNTER — Other Ambulatory Visit (HOSPITAL_COMMUNITY): Payer: Self-pay

## 2022-04-19 ENCOUNTER — Other Ambulatory Visit (HOSPITAL_COMMUNITY): Payer: Self-pay

## 2022-04-22 ENCOUNTER — Other Ambulatory Visit (HOSPITAL_COMMUNITY): Payer: Self-pay

## 2022-04-22 MED FILL — Rivaroxaban Tab 20 MG: ORAL | 30 days supply | Qty: 30 | Fill #0 | Status: AC

## 2022-04-24 ENCOUNTER — Other Ambulatory Visit (HOSPITAL_COMMUNITY): Payer: Self-pay

## 2022-04-25 ENCOUNTER — Other Ambulatory Visit (HOSPITAL_COMMUNITY): Payer: Self-pay

## 2022-04-26 ENCOUNTER — Other Ambulatory Visit (HOSPITAL_COMMUNITY): Payer: Self-pay

## 2022-04-27 ENCOUNTER — Other Ambulatory Visit (HOSPITAL_COMMUNITY): Payer: Self-pay

## 2022-04-28 ENCOUNTER — Other Ambulatory Visit (HOSPITAL_COMMUNITY): Payer: Self-pay

## 2022-05-11 ENCOUNTER — Other Ambulatory Visit (HOSPITAL_COMMUNITY): Payer: Self-pay

## 2022-05-15 ENCOUNTER — Other Ambulatory Visit (HOSPITAL_COMMUNITY): Payer: Self-pay

## 2022-05-16 ENCOUNTER — Other Ambulatory Visit (HOSPITAL_COMMUNITY): Payer: Self-pay

## 2022-05-16 MED FILL — Rivaroxaban Tab 20 MG: ORAL | 30 days supply | Qty: 30 | Fill #1 | Status: AC

## 2022-05-23 ENCOUNTER — Other Ambulatory Visit (HOSPITAL_COMMUNITY): Payer: Self-pay

## 2022-05-23 MED ORDER — WEGOVY 0.25 MG/0.5ML ~~LOC~~ SOAJ
0.2500 mg | SUBCUTANEOUS | 3 refills | Status: AC
  Filled 2022-05-23: qty 2, 28d supply, fill #0

## 2022-05-24 ENCOUNTER — Other Ambulatory Visit (HOSPITAL_COMMUNITY): Payer: Self-pay

## 2022-05-30 ENCOUNTER — Other Ambulatory Visit (HOSPITAL_COMMUNITY): Payer: Self-pay

## 2022-05-30 MED ORDER — CARVEDILOL 12.5 MG PO TABS
12.5000 mg | ORAL_TABLET | Freq: Two times a day (BID) | ORAL | 3 refills | Status: DC
  Filled 2022-05-30: qty 180, 90d supply, fill #0
  Filled 2022-09-10: qty 180, 90d supply, fill #1
  Filled 2022-11-30: qty 180, 90d supply, fill #2
  Filled 2023-03-27: qty 180, 90d supply, fill #3

## 2022-05-31 ENCOUNTER — Other Ambulatory Visit (HOSPITAL_COMMUNITY): Payer: Self-pay

## 2022-06-01 NOTE — Progress Notes (Unsigned)
Cardiology Clinic Note   Patient Name: Roger Humphrey Date of Encounter: 06/05/2022  Primary Care Provider:  Rick Duff, MD Primary Cardiologist:  Sanda Klein, MD  Patient Profile    Roger Humphrey presents to the clinic today for follow-up evaluation of his atrial flutter and coronary artery disease.  Past Medical History    Past Medical History:  Diagnosis Date   Acute systolic CHF (congestive heart failure) ([place]) 04/24/2017   Atrial flutter, paroxysmal ([place]) 03/2017   Chronic anticoagulation 03/2017   Eliquis   Hypertension    Past Surgical History:  Procedure Laterality Date   FACIAL LACERATION REPAIR  05/2010    Allergies  No Known Allergies  History of Present Illness    Roger Humphrey has a PMH of paroxysmal atrial flutter on apixaban, coronary artery disease, HTN, chronic systolic CHF, and noncompliance.  He was initially seen for acute pulmonary edema that occurred in the setting of hypertensive emergency.  He was noted to also have new onset atrial flutter 11/18.  He underwent TEE and DCCV.  His EF was noted to be 30-35% medical therapy was limited due to AKI and recent cocaine use.  A CTA was performed 11/18 which showed evidence of coronary artery disease, occluded large RI with high-grade stenosis in his distal circumflex artery with FFR less than 0.5, moderate diffuse stenosis in his mid LAD 50 to 60%, FFR was 0.75, and small atrial septal defect.  He was lost to follow-up.  He presented back to the emergency department 10/22 with rapid atrial flutter and severe hypertension.  His EKG showed 3-1 AV block, atrial flutter.  He was positive for cocaine.  His BNP was elevated at 324.  His troponin was minimally elevated at 37 and 38.  Emergency department physician recommended CTA to exclude PE.  Patient refused and decided to go home.  He was seen by internal medicine 11/22 who noted irregular heartbeat on exam.  Cardiology was consulted.  However,  he did not show up for his appointment.  He was seen by Dr. Terrence Dupont and was prescribed Entresto, carvedilol, spironolactone, Farxiga, furosemide, and amiodarone.  He was supposed to be placed on Xarelto.  Instead of following up with Dr. Terrence Dupont he decided to switch back to our practice.  He was seen by Dr. Sallyanne Kuster on 7/23.  He continued to be medically noncompliant.  He denied cocaine use in the previous 6 months.  Dr. Sallyanne Kuster recommended outpatient cardioversion and echocardiogram.  He was scheduled for cardioversion 12/21/2021 however the procedure was canceled.  His echocardiogram was not completed.  He was seen b by Robbie Lis PA-C for cardiac clearance and was cleared to hold Xarelto for 3 days prior to his biopsy procedure.  He was instructed to proceed with echocardiogram afterward.  He also emphasized importance of completing the echocardiogram.  His echocardiogram 02/27/2022 showed an EF of 45-50% with no regional wall motion abnormalities, trivial MR and mild AI.  He was seen in follow-up by Almyra Deforest PA-C on 04/03/2022.  He had run out of his amiodarone, Xarelto, furosemide and carvedilol.  He has not been taking medications for approximately 1 month.  His medications were refilled.  He was given samples of Xarelto and medication assistance for Xarelto and Entresto.  Unfortunately even though he has been cleared for his prostate biopsy surgery he had scheduling conflict and his procedure was canceled.  He was encouraged to contact the urology office to reschedule the procedure.  Due to the need  to come off of Xarelto for 3 days for urology procedure his cardioversion was delayed for another 2 months.  Follow-up was planned for 2 months.  His EKG continued to show atrial flutter.  After reviewing Dr. Victorino December previous note it was felt that proceeding with cardioversion post prostate biopsy would be favored.  Assumption that he would be able to be compliant with his Xarelto medication was made.  Post  cardioversion recommendation for repeat coronary CT was discussed.  He presents to the clinic today for follow-up evaluation and states he feels well.  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  He denies irregular or accelerated heart rates.  He denies overly fatigue.  He reports compliance with his medications.  His blood pressure today is well-controlled at 124/80.  He reports that he has not yet had his prostate biopsy.  I encouraged him to contact the urologist to schedule his procedure.  He denies illicit drug use.  His EKG today shows atrial flutter with variable AV block left ventricular hypertrophy with QRS widening and repolarization abnormality 72 bpm.  I will continue his current medication regimen, have him continue to increase/maintain his physical activity, avoid triggers, and plan follow-up for 2 months.  Today he denies chest pain, shortness of breath, lower extremity edema, fatigue, palpitations, melena, hematuria, hemoptysis, diaphoresis, weakness, presyncope, syncope, orthopnea, and PND.    Home Medications    Prior to Admission medications   Medication Sig Start Date End Date Taking? Authorizing Provider  amiodarone (PACERONE) 200 MG tablet Take 1 tablet (200 mg total) by mouth daily. 04/03/22   Almyra Deforest, PA  Ascorbic Acid (VITAMIN C PO) Take 1 tablet by mouth daily in the afternoon.    [provider]  aspirin EC 81 MG tablet Take 81 mg by mouth daily. Swallow whole.    [provider]  atorvastatin (LIPITOR) 40 MG tablet Take 1 tablet (40 mg total) by mouth at bedtime. 01/04/22     carvedilol (COREG) 12.5 MG tablet Take 1 tablet (12.5 mg total) by mouth 2 (two) times daily with food. 04/03/22   Almyra Deforest, PA  carvedilol (COREG) 12.5 MG tablet Take 1 tablet (12.5 mg total) by mouth in the morning and 1 tablet in the evening with food. 05/30/22     Cholecalciferol (VITAMIN D3) 25 MCG (1000 UT) CAPS Take 1 capsule by mouth daily in the afternoon.    [provider]  dapagliflozin propanediol (FARXIGA) 10 MG TABS tablet Take 1 tablet (10 mg total) by mouth daily before breakfast. 12/26/21   Croitoru, Mihai, MD  furosemide (LASIX) 40 MG tablet Take 1 tablet (40 mg total) by mouth daily. 04/03/22   Almyra Deforest, PA  METAMUCIL FIBER PO Take 1 tablet by mouth daily.    [provider]  pantoprazole (PROTONIX) 40 MG tablet Take 1 tablet (40 mg total) by mouth 2 (two) times daily. 01/09/22     psyllium (REGULOID) 0.52 g capsule Take 0.52 g by mouth daily.    [provider]  rivaroxaban (XARELTO) 20 MG TABS tablet Take 1 tablet (20 mg total) by mouth daily with supper. 03/28/22   Croitoru, Mihai, MD  sacubitril-valsartan (ENTRESTO) 49-51 MG Take 1 tablet by mouth 2 (two) times daily. 03/06/22   Croitoru, Mihai, MD  Semaglutide-Weight Management (WEGOVY) 0.25 MG/0.5ML SOAJ Inject 0.25 mg into the skin once a week for 4 weeks 05/23/22     spironolactone (ALDACTONE) 25 MG tablet Take 0.5 tablets (12.5 mg total) by  mouth daily. 01/25/22   Masters, Katie, DO  tetracycline (SUMYCIN) 500 MG capsule Take 500 mg by mouth 4 (four) times daily.    [provider]    Family History    Family History  Problem Relation Age of Onset   Heart murmur Father    Hypertension Father    He indicated that his mother is deceased. He indicated that his father is deceased.  Social History    Social History   Socioeconomic History   Marital status: Married    Spouse name: Not on file   Number of children: Not on file   Years of education: Not on file   Highest education level: Not on file  Occupational History   Not on file  Tobacco Use   Smoking status: Former    Types: Cigarettes    Start date: 05/15/2005    Quit date: 04/16/2017    Years since quitting: 5.1   Smokeless tobacco: Never  Substance and Sexual Activity   Alcohol use: Not on file   Drug use: Yes   Sexual activity: Not on file  Other Topics Concern   Not on file   Social History Narrative   ** Merged History Encounter **       Social Determinants of Health   Financial Resource Strain: Not on file  Food Insecurity: Not on file  Transportation Needs: Not on file  Physical Activity: Not on file  Stress: Not on file  Social Connections: Not on file  Intimate Partner Violence: Not on file     Review of Systems    General:  No chills, fever, night sweats or weight changes.  Cardiovascular:  No chest pain, dyspnea on exertion, edema, orthopnea, palpitations, paroxysmal nocturnal dyspnea. Dermatological: No rash, lesions/masses Respiratory: No cough, dyspnea Urologic: No hematuria, dysuria Abdominal:   No nausea, vomiting, diarrhea, bright red blood per rectum, melena, or hematemesis Neurologic:  No visual changes, wkns, changes in mental status. All other systems reviewed and are otherwise negative except as noted above.  Physical Exam    VS:  BP 124/80 (BP Location: Left Arm, Patient Position: Sitting, Cuff Size: Large)   Pulse 78   Ht 5\' 11"  (1.803 m)   Wt 279 lb (126.6 kg)   BMI 38.91 kg/m  , BMI Body mass index is 38.91 kg/m. GEN: Well nourished, well developed, in no acute distress. HEENT: normal. Neck: Supple, no JVD, carotid bruits, or masses. Cardiac: Atrial flutter with variable AV block, no murmurs, rubs, or gallops. No clubbing, cyanosis, edema.  Radials/DP/PT 2+ and equal bilaterally.  Respiratory:  Respirations regular and unlabored, clear to auscultation bilaterally. GI: Soft, nontender, nondistended, BS + x 4. MS: no deformity or atrophy. Skin: warm and dry, no rash. Neuro:  Strength and sensation are intact. Psych: Normal affect.  Accessory Clinical Findings    Recent Labs: No results found for requested labs within last 365 days.   Recent Lipid Panel    Component Value Date/Time   CHOL 120 03/31/2021 1019   TRIG 102 03/31/2021 1019   HDL 53 03/31/2021 1019   CHOLHDL 2.3 03/31/2021 1019   LDLCALC 48  03/31/2021 1019         ECG personally reviewed by me today-atrial flutter with variable AV block left ventricular hypertrophy with QRS widening and repolarization abnormality QT prolongation 72 bpm  Echocardiogram 02/27/2022   1. Posterior lateral hypokinesis . Left ventricular ejection fraction, by  estimation, is 45 to 50%. The left ventricle has  mildly decreased  function. The left ventricle has no regional wall motion abnormalities.  The left ventricular internal cavity size   was mildly dilated. Left ventricular diastolic parameters are  indeterminate.   2. Right ventricular systolic function is normal. The right ventricular  size is normal.   3. Left atrial size was moderately dilated.   4. The mitral valve is abnormal. Trivial mitral valve regurgitation. No  evidence of mitral stenosis.   5. The aortic valve is tricuspid. There is mild calcification of the  aortic valve. There is mild thickening of the aortic valve. Aortic valve  regurgitation is mild. Aortic valve sclerosis is present, with no evidence  of aortic valve stenosis.   6. The inferior vena cava is normal in size with greater than 50%  respiratory variability, suggesting right atrial pressure of 3 mmHg.   CHA2DS2-VASc Score = 4   This indicates a 4.8% annual risk of stroke. The patient's score is based upon: CHF History: 1 HTN History: 1 Diabetes History: 0 Stroke History: 0 Vascular Disease History: 1 Age Score: 1 Gender Score: 0  Assessment & Plan   1.  Atrial flutter-EKG today shows atrial flutter with variable AV block 72 bpm.  Plan was previously to complete cardioversion after prostate biopsy.  During his 04/03/2022 appointment it was recommended that he contact his urologist and proceed with prostate biopsy prior to DCCV, TEE Continue Xarelto, carvedilol, amiodarone Avoid triggers  Coronary artery disease-denies recent episodes of chest discomfort.  Previously plan for repeat coronary CT post  DCCV. Continue current medical therapy  Chronic systolic CHF-echocardiogram showed EF 45-50% on 02/27/2022.  Has had issues with medication compliance. Continue furosemide, carvedilol, Entresto Heart healthy low-sodium diet-salty 6 given Increase physical activity as tolerated  Essential hypertension-BP today 124/80 Maintain blood pressure log Heart healthy low-sodium diet-salty 6 given Continue Entresto, carvedilol, furosemide  History of noncompliance-previously noted to be noncompliant with his medications however he had refills of his medication left.  Previously indicated that he had not taken his blood thinner for about 1 month.  Today he reports compliance with his medications.  Patient encouraged to contact urology to schedule prostate biopsy.  Again explained to patient that we would proceed with cardioversion post prostate biopsy.  He expressed understanding.  Disposition: Follow-up with Dr. [name] or Azalee Course PA-C in 2-3 months.   Thomasene Ripple. Tamani Durney NP-C     06/05/2022, 8:03 AM [facility] [address] Office [phone] Fax [phone]    I spent 14 minutes examining this patient, reviewing medications, and using patient centered shared decision making involving her cardiac care.  Prior to her visit I spent greater than 20 minutes reviewing her past medical history,  medications, and prior cardiac tests.

## 2022-06-05 ENCOUNTER — Ambulatory Visit: Payer: Medicare HMO | Attending: General Practice | Admitting: General Practice

## 2022-06-05 ENCOUNTER — Encounter: Payer: Self-pay | Admitting: General Practice

## 2022-06-05 VITALS — BP 124/80 | HR 78 | Ht 71.0 in | Wt 279.0 lb

## 2022-06-05 DIAGNOSIS — I483 Typical atrial flutter: Secondary | ICD-10-CM | POA: Diagnosis not present

## 2022-06-05 DIAGNOSIS — I2510 Atherosclerotic heart disease of native coronary artery without angina pectoris: Secondary | ICD-10-CM

## 2022-06-05 DIAGNOSIS — I5022 Chronic systolic (congestive) heart failure: Secondary | ICD-10-CM

## 2022-06-05 DIAGNOSIS — Z91199 Patient's noncompliance with other medical treatment and regimen due to unspecified reason: Secondary | ICD-10-CM

## 2022-06-05 DIAGNOSIS — I10 Essential (primary) hypertension: Secondary | ICD-10-CM

## 2022-06-05 NOTE — Patient Instructions (Signed)
Medication Instructions:  The current medical regimen is effective;  continue present plan and medications as directed. Please refer to the Current Medication list given to you today.  *If you need a refill on your cardiac medications before your next appointment, please call your pharmacy*  Lab Work: NONE If you have labs (blood work) drawn today and your tests are completely normal, you will receive your results only by:  [facility] (if you have MyChart) OR A paper copy in the mail If you have any lab test that is abnormal or we need to change your treatment, we will call you to review the results.  Testing/Procedures: NONE  Follow-Up: At [facility], you and your health needs are our priority.  As part of our continuing mission to provide you with exceptional heart care, we have created designated Provider Care Teams.  These Care Teams include your primary Cardiologist (physician) and Advanced Practice Providers (APPs -  Physician Assistants and Nurse Practitioners) who all work together to provide you with the care you need, when you need it.  Your next appointment:   2-3 month(s)  The format for your next appointment:   In Person  Provider:   Sanda Klein, MD  or Almyra Deforest, PA-C       Other Instructions F/U AFTER PROSTATE BIOPSY  Important Information About Sugar

## 2022-06-07 ENCOUNTER — Other Ambulatory Visit (HOSPITAL_COMMUNITY): Payer: Self-pay

## 2022-06-19 ENCOUNTER — Other Ambulatory Visit (HOSPITAL_COMMUNITY): Payer: Self-pay

## 2022-07-07 ENCOUNTER — Other Ambulatory Visit (HOSPITAL_COMMUNITY): Payer: Self-pay

## 2022-07-07 ENCOUNTER — Other Ambulatory Visit: Payer: Self-pay | Admitting: Cardiovascular Disease

## 2022-07-07 MED ORDER — ENTRESTO 49-51 MG PO TABS
1.0000 | ORAL_TABLET | Freq: Two times a day (BID) | ORAL | 2 refills | Status: DC
  Filled 2022-07-07: qty 60, 30d supply, fill #0
  Filled 2022-08-05: qty 60, 30d supply, fill #1
  Filled 2022-09-14: qty 60, 30d supply, fill #2

## 2022-07-07 MED FILL — Rivaroxaban Tab 20 MG: ORAL | 30 days supply | Qty: 30 | Fill #2 | Status: AC

## 2022-08-05 ENCOUNTER — Other Ambulatory Visit: Payer: Self-pay | Admitting: Cardiovascular Disease

## 2022-08-05 DIAGNOSIS — I4892 Unspecified atrial flutter: Secondary | ICD-10-CM

## 2022-08-07 ENCOUNTER — Other Ambulatory Visit (HOSPITAL_COMMUNITY): Payer: Self-pay

## 2022-08-07 MED ORDER — RIVAROXABAN 20 MG PO TABS
20.0000 mg | ORAL_TABLET | Freq: Every day | ORAL | 2 refills | Status: DC
  Filled 2022-08-07: qty 30, 30d supply, fill #0
  Filled 2022-09-16 – 2022-09-18 (×2): qty 30, 30d supply, fill #1
  Filled 2022-10-20: qty 30, 30d supply, fill #2

## 2022-08-07 NOTE — Telephone Encounter (Signed)
Prescription refill request for Xarelto received.  Indication: A Flutter Last office visit: 06/05/22  Thomasene Mohair NP Weight: 136.6kg Age: 67 Scr: 1.07 on 03/31/21 CrCl: 132.98  Based on above findings Xarelto '20mg'$  daily is the appropriate dose.  Refill approved.  Labs are past due.  Requested they be done at upcoming appt on 09/12/22.

## 2022-08-14 ENCOUNTER — Other Ambulatory Visit (HOSPITAL_COMMUNITY): Payer: Self-pay

## 2022-09-11 ENCOUNTER — Other Ambulatory Visit: Payer: Self-pay

## 2022-09-11 ENCOUNTER — Other Ambulatory Visit (HOSPITAL_COMMUNITY): Payer: Self-pay

## 2022-09-12 ENCOUNTER — Ambulatory Visit: Payer: Medicare HMO | Admitting: Cardiovascular Disease

## 2022-09-14 ENCOUNTER — Other Ambulatory Visit (HOSPITAL_COMMUNITY): Payer: Self-pay

## 2022-09-15 ENCOUNTER — Other Ambulatory Visit (HOSPITAL_COMMUNITY): Payer: Self-pay

## 2022-09-18 ENCOUNTER — Other Ambulatory Visit (HOSPITAL_COMMUNITY): Payer: Self-pay

## 2022-10-20 ENCOUNTER — Other Ambulatory Visit: Payer: Self-pay | Admitting: Cardiovascular Disease

## 2022-10-20 ENCOUNTER — Other Ambulatory Visit: Payer: Self-pay | Admitting: Physician Assistant

## 2022-10-24 ENCOUNTER — Other Ambulatory Visit (HOSPITAL_COMMUNITY): Payer: Self-pay

## 2022-10-24 MED ORDER — ENTRESTO 49-51 MG PO TABS
1.0000 | ORAL_TABLET | Freq: Two times a day (BID) | ORAL | 7 refills | Status: DC
  Filled 2022-10-24: qty 60, 30d supply, fill #0

## 2022-10-24 MED ORDER — AMIODARONE HCL 200 MG PO TABS
200.0000 mg | ORAL_TABLET | Freq: Every day | ORAL | 2 refills | Status: DC
  Filled 2022-10-24: qty 90, 90d supply, fill #0
  Filled 2023-02-03: qty 90, 90d supply, fill #1
  Filled 2023-05-19 – 2023-05-21 (×2): qty 90, 90d supply, fill #2

## 2022-10-25 ENCOUNTER — Other Ambulatory Visit (HOSPITAL_COMMUNITY): Payer: Self-pay

## 2022-10-25 MED ORDER — XARELTO 20 MG PO TABS
20.0000 mg | ORAL_TABLET | Freq: Every day | ORAL | 3 refills | Status: AC
  Filled 2022-10-25: qty 90, 90d supply, fill #0

## 2022-10-26 ENCOUNTER — Telehealth: Payer: Self-pay | Admitting: Cardiovascular Disease

## 2022-10-26 ENCOUNTER — Other Ambulatory Visit (HOSPITAL_COMMUNITY): Payer: Self-pay

## 2022-10-26 MED ORDER — ENTRESTO 49-51 MG PO TABS
1.0000 | ORAL_TABLET | Freq: Two times a day (BID) | ORAL | 6 refills | Status: DC
  Filled 2022-10-27: qty 60, 30d supply, fill #0

## 2022-10-26 NOTE — Telephone Encounter (Signed)
*  STAT* If patient is at the pharmacy, call can be transferred to refill team.   1. Which medications need to be refilled? (please list name of each medication and dose if known) sacubitril-valsartan (ENTRESTO) 49-51 MG   2. Which pharmacy/location (including street and city if local pharmacy) is medication to be sent to? [facility]    3. Do they need a 30 day or 90 day supply? 90 day

## 2022-10-27 ENCOUNTER — Other Ambulatory Visit (HOSPITAL_COMMUNITY): Payer: Self-pay

## 2022-10-30 ENCOUNTER — Other Ambulatory Visit (HOSPITAL_COMMUNITY): Payer: Self-pay

## 2022-10-30 MED ORDER — ENTRESTO 49-51 MG PO TABS: 1.0000 | ORAL_TABLET | Freq: Two times a day (BID) | ORAL | 2 refills | Status: AC

## 2022-10-30 NOTE — Telephone Encounter (Signed)
Pt's medication was resent to pt's pharmacy as requested. Confirmation received.  °

## 2022-10-30 NOTE — Telephone Encounter (Signed)
Patient is following up stating that he is completely out of medication.

## 2022-10-30 NOTE — Addendum Note (Signed)
Addended by: Margaret Pyle D on: 10/30/2022 04:33 PM   Modules accepted: Orders

## 2022-11-02 ENCOUNTER — Other Ambulatory Visit (HOSPITAL_COMMUNITY): Payer: Self-pay

## 2022-11-13 ENCOUNTER — Other Ambulatory Visit (HOSPITAL_COMMUNITY): Payer: Self-pay

## 2022-11-13 MED ORDER — ATORVASTATIN CALCIUM 40 MG PO TABS
40.0000 mg | ORAL_TABLET | Freq: Every day | ORAL | 3 refills | Status: DC
  Filled 2022-11-13 – 2023-02-13 (×2): qty 90, 90d supply, fill #0
  Filled 2023-06-22: qty 90, 90d supply, fill #1
  Filled 2023-09-02: qty 90, 90d supply, fill #2

## 2022-11-13 MED ORDER — XARELTO 20 MG PO TABS
20.0000 mg | ORAL_TABLET | Freq: Every day | ORAL | 3 refills | Status: DC
  Filled 2022-11-13 – 2023-06-15 (×2): qty 90, 90d supply, fill #0
  Filled 2023-09-18 – 2023-10-08 (×3): qty 90, 90d supply, fill #1

## 2022-11-13 MED ORDER — PANTOPRAZOLE SODIUM 40 MG PO TBEC
40.0000 mg | DELAYED_RELEASE_TABLET | Freq: Every day | ORAL | 3 refills | Status: DC
  Filled 2022-11-13: qty 90, 90d supply, fill #0
  Filled 2022-12-28 – 2023-02-03 (×2): qty 90, 90d supply, fill #1
  Filled 2023-02-10 – 2023-05-21 (×3): qty 90, 90d supply, fill #2
  Filled 2023-09-02: qty 90, 90d supply, fill #3

## 2022-11-15 LAB — LAB REPORT - SCANNED
EGFR: 58
PSA, FREE: 1.6
PSA, Total: 14.1

## 2022-11-16 ENCOUNTER — Other Ambulatory Visit (HOSPITAL_COMMUNITY): Payer: Self-pay

## 2022-11-16 MED ORDER — VITAMIN D3 50 MCG (2000 UT) PO CAPS
2000.0000 [IU] | ORAL_CAPSULE | Freq: Every morning | ORAL | 3 refills | Status: AC
  Filled 2022-11-16: qty 90, 90d supply, fill #0

## 2022-11-23 ENCOUNTER — Other Ambulatory Visit (HOSPITAL_COMMUNITY): Payer: Self-pay

## 2022-11-23 MED ORDER — EPINEPHRINE 0.3 MG/0.3ML IJ SOAJ
0.3000 mg | INTRAMUSCULAR | 0 refills | Status: AC | PRN
  Filled 2022-11-23: qty 2, 30d supply, fill #0

## 2022-11-30 ENCOUNTER — Other Ambulatory Visit: Payer: Self-pay | Admitting: Cardiovascular Disease

## 2022-11-30 DIAGNOSIS — I4892 Unspecified atrial flutter: Secondary | ICD-10-CM

## 2022-12-01 ENCOUNTER — Other Ambulatory Visit (HOSPITAL_COMMUNITY): Payer: Self-pay

## 2022-12-01 MED ORDER — RIVAROXABAN 20 MG PO TABS
20.0000 mg | ORAL_TABLET | Freq: Every day | ORAL | 0 refills | Status: DC
Start: 2022-12-01 — End: 2022-12-14
  Filled 2022-12-01: qty 30, 30d supply, fill #0

## 2022-12-01 NOTE — Telephone Encounter (Addendum)
Xarelto 20mg  refill request received. Pt is 67 years old, weight-126.6kg, Crea-1.07 on 03/31/21 needs updated labs, last seen by [name] on 06/05/22, Diagnosis-Aflutter, CrCl- 123.25 mL/min; Dose is appropriate based on dosing criteria.   Spoke with pt and advised we need updated labs and he states he will be able to come over on Monday at 8am to get them done. Advised he will need to ensure to get the labs drawn and that we can send a 30 day supply of xarelto until labs are obtained and resulted. Ordered labs and sent in 30 day supply.

## 2022-12-04 ENCOUNTER — Telehealth: Payer: Self-pay | Admitting: Cardiovascular Disease

## 2022-12-04 NOTE — Telephone Encounter (Signed)
Brayton Caves, from [facility] called stating patient is having labs done at their office tomorrow, if we can fax over lab orders to the labs he needs done for [place] to them and they can do them as well.  Their fax number is [phone].

## 2022-12-04 NOTE — Telephone Encounter (Signed)
Routed to [facility] regarding orders

## 2022-12-04 NOTE — Telephone Encounter (Addendum)
Faxed over lab orders (cbc & bmet) to [place] at [facility].   Correct fax Number is [phone]

## 2022-12-06 ENCOUNTER — Other Ambulatory Visit: Payer: Self-pay

## 2022-12-06 ENCOUNTER — Other Ambulatory Visit (HOSPITAL_COMMUNITY): Payer: Self-pay

## 2022-12-14 ENCOUNTER — Other Ambulatory Visit: Payer: Self-pay | Admitting: Cardiovascular Disease

## 2022-12-14 ENCOUNTER — Other Ambulatory Visit (HOSPITAL_COMMUNITY): Payer: Self-pay

## 2022-12-14 DIAGNOSIS — I4892 Unspecified atrial flutter: Secondary | ICD-10-CM

## 2022-12-14 MED ORDER — RIVAROXABAN 20 MG PO TABS
20.0000 mg | ORAL_TABLET | Freq: Every day | ORAL | 5 refills | Status: AC
Start: 2022-12-14 — End: ?
  Filled 2022-12-14 – 2022-12-28 (×2): qty 30, 30d supply, fill #0
  Filled 2023-01-20 – 2023-01-28 (×2): qty 30, 30d supply, fill #1
  Filled 2023-02-28: qty 30, 30d supply, fill #2
  Filled 2023-03-27: qty 30, 30d supply, fill #3
  Filled 2023-04-26: qty 30, 30d supply, fill #4

## 2022-12-14 NOTE — Telephone Encounter (Signed)
Prescription refill request for Xarelto received.  Indication: Aflutter Last office visit: 06/05/22 Molli Hazard)  Weight: 126.6kg Age: 67 Scr: 1.36 (11/13/22)  CrCl: 96.64ml/min  Appropriate dose. Refill sent.

## 2022-12-28 ENCOUNTER — Other Ambulatory Visit: Payer: Self-pay | Admitting: Internal Medicine

## 2022-12-28 ENCOUNTER — Other Ambulatory Visit: Payer: Self-pay | Admitting: Cardiovascular Disease

## 2022-12-29 ENCOUNTER — Other Ambulatory Visit: Payer: Self-pay

## 2022-12-29 ENCOUNTER — Other Ambulatory Visit (HOSPITAL_COMMUNITY): Payer: Self-pay

## 2022-12-29 MED ORDER — DAPAGLIFLOZIN PROPANEDIOL 10 MG PO TABS
10.0000 mg | ORAL_TABLET | Freq: Every day | ORAL | 5 refills | Status: DC
  Filled 2022-12-29: qty 30, 30d supply, fill #0
  Filled 2023-01-28: qty 30, 30d supply, fill #1
  Filled 2023-02-28: qty 30, 30d supply, fill #2
  Filled 2023-04-13: qty 30, 30d supply, fill #3
  Filled 2023-04-26 – 2023-05-12 (×2): qty 30, 30d supply, fill #4

## 2023-01-22 ENCOUNTER — Other Ambulatory Visit (HOSPITAL_COMMUNITY): Payer: Self-pay

## 2023-02-03 ENCOUNTER — Other Ambulatory Visit (HOSPITAL_COMMUNITY): Payer: Self-pay

## 2023-02-03 ENCOUNTER — Other Ambulatory Visit: Payer: Self-pay | Admitting: Internal Medicine

## 2023-02-05 ENCOUNTER — Other Ambulatory Visit (HOSPITAL_COMMUNITY): Payer: Self-pay

## 2023-02-05 MED ORDER — SPIRONOLACTONE 25 MG PO TABS
12.5000 mg | ORAL_TABLET | Freq: Every day | ORAL | 3 refills | Status: AC
  Filled 2023-02-05 – 2023-02-23 (×2): qty 45, 90d supply, fill #0
  Filled 2023-05-19 – 2023-05-21 (×2): qty 45, 90d supply, fill #1
  Filled 2023-09-02: qty 45, 90d supply, fill #2
  Filled 2023-11-22: qty 45, 90d supply, fill #3

## 2023-02-06 NOTE — Progress Notes (Deleted)
Cardiology Office Note:    Date:  02/06/2023   ID:  Roger Humphrey, DOB May 23, 1956, MRN [id number]  PCP:  No primary care provider on file.   [facility] Providers Cardiologist:  Thurmon Fair, MD     Referring MD: Marolyn Haller, MD   No chief complaint on file. This is his first appointment in office since July 2019.  History of Present Illness:    Roger Humphrey is a 67 y.o. male returns for follow-up for atrial flutter, CHF with reduced LVEF, CAD.  He was last seen in our office in January 2024, when he was planning a prostate biopsy.  We are planning to perform a cardioversion after that was completed.  He initially presented with acute pulmonary edema that occurred in the setting of hypertensive emergency and new onset atrial flutter in November 2018.  He underwent TEE cardioversion at that time.  LVEF was 30-35%, but medical therapy with beta-blockers and angiotensin receptor blockers was limited due to acute kidney injury and recent ("one-time") cocaine use.  Coronary CT angiogram performed in November 2018 showed evidence of coronary artery disease with an occluded large ramus intermedius, high-grade stenosis in the distal small left circumflex coronary artery (FFR less than 0.5) and moderate diffuse stenosis in the mid LAD artery estimated at maximum 50-60% (FFR 0.75).  A small secundum atrial septal defect was also reported.  He was subsequently lost to follow-up in our practice.    He presented to the emergency room in October 2022 with a rapid irregular heartbeat and severe hypertension.  ECG showed atrial flutter with mostly 3: 1 AV block and a ventricular rate of 97 bpm.  Once again he had recently used cocaine.  Labs are significant for mildly elevated BNP at 324 and minimal elevation in hs troponin at 37-38.  After rate control with a single dose of diltiazem he reportedly converted to sinus rhythm (but all ECG tracings available for review show atrial  flutter).  The emergency room physician recommended a CT angiogram to exclude pulmonary embolism but the patient refused and decided to go home.  He was later seen in the internal medicine clinic on 03/31/2021 when he was in an irregular rhythm by exam (ECG not performed).  He was scheduled for an office appointment on 04/15/2021 with [facility] but did not show up for that appointment.    He firmly denies any recent use of cocaine.  He has been working as a Education administrator, without much in the way of symptoms until recently.  He became symptomatic again about 2 weeks ago with abrupt onset of dyspnea, palpitations and later dizziness.  Unfortunately he has missed his Xarelto for considerable amount of time until about a week or 2 ago when he finally refilled his prescription.  The pattern of noncompliance apparently extends to many other of the medications that he is prescribed.  For what it is worth his prescription list contains Entresto 49-51 mg twice daily, carvedilol 12.5 mg twice daily, Farxiga 10 mg daily,  spironolactone 12.5 mg once daily and furosemide 40 mg daily in addition to amiodarone 200 mg daily and atorvastatin 40 mg daily.  Past Medical History:  Diagnosis Date   Acute systolic CHF (congestive heart failure) (HCC) 04/24/2017   Atrial flutter, paroxysmal (HCC) 03/2017   Chronic anticoagulation 03/2017   Eliquis   Hypertension     Past Surgical History:  Procedure Laterality Date   FACIAL LACERATION REPAIR  05/2010    Current Medications: No outpatient  medications have been marked as taking for the 02/07/23 encounter (Appointment) with Thurmon Fair, MD.     Allergies:   Patient has no known allergies.   Social History   Socioeconomic History   Marital status: Married    Spouse name: Not on file   Number of children: Not on file   Years of education: Not on file   Highest education level: Not on file  Occupational History   Not on file  Tobacco Use   Smoking  status: Former    Current packs/day: 0.00    Types: Cigarettes    Start date: 05/15/2005    Quit date: 04/16/2017    Years since quitting: 5.8   Smokeless tobacco: Never  Substance and Sexual Activity   Alcohol use: Not on file   Drug use: Yes   Sexual activity: Not on file  Other Topics Concern   Not on file  Social History Narrative   ** Merged History Encounter **       Social Determinants of Health   Financial Resource Strain: Not on file  Food Insecurity: Not on file  Transportation Needs: Not on file  Physical Activity: Not on file  Stress: Not on file  Social Connections: Not on file     Family History: The patient's family history includes Heart murmur in his father; Hypertension in his father.  ROS:   Please see the history of present illness.     All other systems reviewed and are negative.  EKGs/Labs/Other Studies Reviewed:    The following studies were reviewed today: Coronary CT angiogram 04/27/2017 1.  Small secundum ASD noted. 2. Coronary artery calcium score 990 Agatston units, placing the patient in the 96th percentile for age and gender. This suggests high risk for future cardiac events. 3.  Large ramus occluded proximally. 4. Small AV LCx with moderate stenosis proximally and severe stenosis more distally. This vessel is unlikely to be amenable to PCI. 5.  Nondominant RCA. 6. Diffuse disease in the LAD, no areas look critical. 50-60% range stenosis in the proximal LAD with up to 50% stenosis in the mid LAD and mild to moderate distal LAD stenosis.  CT FFR:   1. Occluded ramus. 2. FFR < 0.5 in the distal LCx, suggesting hemodynamically significant stenosis. 3. FFR 0.75 in the mid LAD, suggesting hemodynamically significant stenosis.   Echocardiogram 02/27/2022    1. Posterior lateral hypokinesis . Left ventricular ejection fraction, by  estimation, is 45 to 50%. The left ventricle has mildly decreased  function. The left ventricle has  no regional wall motion abnormalities.  The left ventricular internal cavity size   was mildly dilated. Left ventricular diastolic parameters are  indeterminate.   2. Right ventricular systolic function is normal. The right ventricular  size is normal.   3. Left atrial size was moderately dilated.   4. The mitral valve is abnormal. Trivial mitral valve regurgitation. No  evidence of mitral stenosis.   5. The aortic valve is tricuspid. There is mild calcification of the  aortic valve. There is mild thickening of the aortic valve. Aortic valve  regurgitation is mild. Aortic valve sclerosis is present, with no evidence  of aortic valve stenosis.   6. The inferior vena cava is normal in size with greater than 50%  respiratory variability, suggesting right atrial pressure of 3 mmHg.   TEE 04/26/2017  - Left ventricle: The cavity size was mildly dilated. Wall    thickness was increased increased in a pattern  of mild to    moderate LVH. Left ventricular geometry showed evidence of    concentric hypertrophy. Systolic function was severely reduced.    The estimated ejection fraction was in the range of 25% to 30%.    Severe diffuse hypokinesis with regional variations.    Disproportionately severe hypokinesis of the anteroseptal and    anterior myocardium; consistent with ischemia in the distribution    of the left anterior descending coronary artery.  - Left atrium: The atrium was moderately dilated. No evidence of    thrombus in the atrial cavity or appendage. No spontaneous echo    contrast was observed.  - Right ventricle: Systolic function was mildly reduced.  - Right atrium: The atrium was moderately dilated.  - Atrial septum: The septum bowed from left to right, consistent    with increased left atrial pressure. There was a small secundum    atrial septal defect. Doppler showed a moderate left-to-right    atrial level shunt.  - Pericardium, extracardiac: There was a left pleural  effusion.   EKG:  EKG is ordered today.  The ekg ordered today demonstrates typical atrial flutter (right atrial counterclockwise), otherwise normal tracing.  Difficult to accurately measure the QTc.  Recent Labs: No results found for requested labs within last 365 days.  Recent Lipid Panel    Component Value Date/Time   CHOL 120 03/31/2021 1019   TRIG 102 03/31/2021 1019   HDL 53 03/31/2021 1019   CHOLHDL 2.3 03/31/2021 1019   LDLCALC 48 03/31/2021 1019     Risk Assessment/Calculations:    CHA2DS2-VASc Score =     This indicates a  % annual risk of stroke. The patient's score is based upon:    {This patient has a significant risk of stroke if diagnosed with atrial fibrillation.  Please consider VKA or DOAC agent for anticoagulation if the bleeding risk is acceptable.   You can also use the SmartPhrase .[email or web address] for documentation.   :[id number]}       Physical Exam:    VS:  There were no vitals taken for this visit.    Wt Readings from Last 3 Encounters:  06/05/22 279 lb (126.6 kg)  04/03/22 267 lb 9.6 oz (121.4 kg)  01/31/22 263 lb 6.4 oz (119.5 kg)     GEN: Appears very muscular and fit but also obese, well nourished, well developed in no acute distress HEENT: Normal NECK: No JVD; No carotid bruits LYMPHATICS: No lymphadenopathy CARDIAC: Irregular rhythm, no murmurs, rubs, gallops RESPIRATORY:  Clear to auscultation without rales, wheezing or rhonchi  ABDOMEN: Soft, non-tender, non-distended MUSCULOSKELETAL:  No edema; No deformity  SKIN: Warm and dry NEUROLOGIC:  Alert and oriented x 3 PSYCHIATRIC:  Normal affect   ASSESSMENT:    No diagnosis found.  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  Need to get an updated echocardiogram, although with his recent rhythm  problems this may still not clarify the situation.  Should continue treatment with guideline directed medical therapy to include Entresto, carvedilol, spironolactone, Marcelline Deist and an appropriate amount of loop diuretic.  Despite the ongoing arrhythmia he currently appears to be NYHA functional class 1-2 relatively mildly symptomatic.  Clinically he appears to be euvolemic. AFlutter: Some of his notes mention atrial fibrillation but every single tracing reviewed shows typical counterclockwise atrial  flutter.  He may be very well suited for cavotricuspid isthmus ablation, but first may need to establish some degree of compliance with medications and follow-up.  We will schedule him for direct-current cardioversion, but only after 3 weeks of uninterrupted anticoagulation.  I impressed upon him the critical importance of anticoagulation without interruption to prevent post cardioversion stroke. The electrical cardioversion with deep sedation procedure has been fully reviewed with the patient and written informed consent has been obtained. CAD: He has never complained of angina, although he has evidence of occlusion of the ramus intermedius artery and high-grade stenosis in a small left circumflex coronary artery as well as moderate diffuse stenosis in the mid LAD artery on a CT coronary angiogram performed in 2018.  If the coronary anatomy is unchanged, in the absence of symptoms he does not require revascularization.  After we get him back in normal rhythm it may be worthwhile repeating a CT angiogram.  Meanwhile, focus on risk factor modification, including lowering LDL cholesterol to less than 70, avoiding smoking cigarettes and cocaine, maintaining normal blood pressure. HTN: Well-controlled at this time, suggesting he is taking his medications. Cocaine use: He firmly denies any cocaine use in the last 6 months.  He does not intend to resume cocaine use.  We discussed the risk of complications due to interaction  between cocaine and heart failure medications.  Carvedilol is a good choice due to combined alpha-beta-blockade in the circumstances. Anticoagulation: Repeatedly stressed the importance of uninterrupted anticoagulation for 3 weeks before and 4 weeks after cardioversion, to avoid stroke.      Shared Decision Making/Informed Consent The risks (stroke, cardiac arrhythmias rarely resulting in the need for a temporary or permanent pacemaker, skin irritation or burns and complications associated with conscious sedation including aspiration, arrhythmia, respiratory failure and death), benefits (restoration of normal sinus rhythm) and alternatives of a direct current cardioversion were explained in detail to Mr. Dougall and he agrees to proceed.      Medication Adjustments/Labs and Tests Ordered: Current medicines are reviewed at length with the patient today.  Concerns regarding medicines are outlined above.  No orders of the defined types were placed in this encounter.  No orders of the defined types were placed in this encounter.   There are no Patient Instructions on file for this visit.   Signed, Thurmon Fair, MD  02/06/2023 9:00 AM    [facility]

## 2023-02-07 ENCOUNTER — Ambulatory Visit: Payer: Medicare HMO | Attending: Cardiovascular Disease | Admitting: Cardiovascular Disease

## 2023-02-07 DIAGNOSIS — I2510 Atherosclerotic heart disease of native coronary artery without angina pectoris: Secondary | ICD-10-CM

## 2023-02-07 DIAGNOSIS — I483 Typical atrial flutter: Secondary | ICD-10-CM

## 2023-02-07 DIAGNOSIS — D6869 Other thrombophilia: Secondary | ICD-10-CM

## 2023-02-07 DIAGNOSIS — I10 Essential (primary) hypertension: Secondary | ICD-10-CM

## 2023-02-07 DIAGNOSIS — I5042 Chronic combined systolic (congestive) and diastolic (congestive) heart failure: Secondary | ICD-10-CM

## 2023-02-07 DIAGNOSIS — F1491 Cocaine use, unspecified, in remission: Secondary | ICD-10-CM

## 2023-02-08 ENCOUNTER — Encounter: Payer: Self-pay | Admitting: Cardiovascular Disease

## 2023-02-11 ENCOUNTER — Other Ambulatory Visit (HOSPITAL_COMMUNITY): Payer: Self-pay

## 2023-02-13 ENCOUNTER — Other Ambulatory Visit (HOSPITAL_COMMUNITY): Payer: Self-pay

## 2023-02-14 ENCOUNTER — Other Ambulatory Visit (HOSPITAL_COMMUNITY): Payer: Self-pay

## 2023-02-16 ENCOUNTER — Other Ambulatory Visit (HOSPITAL_COMMUNITY): Payer: Self-pay

## 2023-02-21 ENCOUNTER — Other Ambulatory Visit (HOSPITAL_COMMUNITY): Payer: Self-pay

## 2023-02-21 MED ORDER — ATORVASTATIN CALCIUM 40 MG PO TABS
40.0000 mg | ORAL_TABLET | Freq: Every day | ORAL | 3 refills | Status: AC
  Filled 2023-02-21: qty 90, 90d supply, fill #0

## 2023-02-21 MED ORDER — PANTOPRAZOLE SODIUM 40 MG PO TBEC: 40.0000 mg | DELAYED_RELEASE_TABLET | Freq: Every day | ORAL | 3 refills | Status: AC

## 2023-02-23 ENCOUNTER — Other Ambulatory Visit (HOSPITAL_COMMUNITY): Payer: Self-pay

## 2023-03-02 ENCOUNTER — Other Ambulatory Visit (HOSPITAL_COMMUNITY): Payer: Self-pay

## 2023-03-28 ENCOUNTER — Other Ambulatory Visit (HOSPITAL_COMMUNITY): Payer: Self-pay

## 2023-04-11 ENCOUNTER — Telehealth: Payer: Self-pay | Admitting: Cardiovascular Disease

## 2023-04-11 NOTE — Telephone Encounter (Signed)
Pt c/o of Chest Pain: STAT if CP now or developed within 24 hours  1. Are you having CP right now? No  2. Are you experiencing any other symptoms (ex. SOB, nausea, vomiting, sweating)? No  3. How long have you been experiencing CP? 2 weeks  4. Is your CP continuous or coming and going? Coming and going  5. Have you taken Nitroglycerin? No  ?

## 2023-04-11 NOTE — Telephone Encounter (Signed)
CP comes and goes over the last 2 weeks. This happens at rest- it will last about 5-15 minutes on and off and goes away on its own. He does not have any shortness of breath, dizziness, pain that radiates anywhere, no sweating, no swelling, no nausea along with this pain. He rates the pain about a 3/10. He reports it has been happening about once a day.   He reports that it does not happen when he is active, the only time he has noticed it is at rest. He is overdue for a follow up appointment with Dr [name]. He also reports that he went to get Xarelto refilled and was told that he could not get it filled? He has about 6-7 days left. Informed him that maybe he needs some repeat blood work before he can get a refill?  Scheduled an appt with [name], NP for Friday 04/13/23 at 0825. Given ER precautions. He verbalized understanding.

## 2023-04-12 NOTE — Progress Notes (Deleted)
Cardiology Clinic Note   Patient Name: Roger Humphrey Date of Encounter: 04/12/2023  Primary Care Provider:  No primary care provider on file. Primary Cardiologist:  Thurmon Fair, MD  Patient Profile    Roger Humphrey presents to the clinic today for follow-up evaluation of his chest discomfort.  Past Medical History    Past Medical History:  Diagnosis Date   Acute systolic CHF (congestive heart failure) (HCC) 04/24/2017   Atrial flutter, paroxysmal (HCC) 03/2017   Chronic anticoagulation 03/2017   Eliquis   Hypertension    Past Surgical History:  Procedure Laterality Date   FACIAL LACERATION REPAIR  05/2010    Allergies  No Known Allergies  History of Present Illness    Roger Humphrey has a PMH of paroxysmal atrial flutter on apixaban, coronary artery disease, HTN, chronic systolic CHF, and noncompliance.  He was initially seen for acute pulmonary edema that occurred in the setting of hypertensive emergency.  He was noted to also have new onset atrial flutter 11/18.  He underwent TEE and DCCV.  His EF was noted to be 30-35% medical therapy was limited due to AKI and recent cocaine use.  A CTA was performed 11/18 which showed evidence of coronary artery disease, occluded large RI with high-grade stenosis in his distal circumflex artery with FFR less than 0.5, moderate diffuse stenosis in his mid LAD 50 to 60%, FFR was 0.75, and small atrial septal defect.  He was lost to follow-up.  He presented back to the emergency department 10/22 with rapid atrial flutter and severe hypertension.  His EKG showed 3-1 AV block, atrial flutter.  He was positive for cocaine.  His BNP was elevated at 324.  His troponin was minimally elevated at 37 and 38.  Emergency department physician recommended CTA to exclude PE.  Patient refused and decided to go home.  He was seen by internal medicine 11/22 who noted irregular heartbeat on exam.  Cardiology was consulted.  However, he did not  show up for his appointment.  He was seen by Dr. [name] and was prescribed Entresto, carvedilol, spironolactone, Farxiga, furosemide, and amiodarone.  He was supposed to be placed on Xarelto.  Instead of following up with Dr. [name] he decided to switch back to our practice.  He was seen by Dr. [name] on 7/23.  He continued to be medically noncompliant.  He denied cocaine use in the previous 6 months.  Dr. [name] recommended outpatient cardioversion and echocardiogram.  He was scheduled for cardioversion 12/21/2021 however the procedure was canceled.  His echocardiogram was not completed.  He was seen b by Chelsea Aus PA-C for cardiac clearance and was cleared to hold Xarelto for 3 days prior to his biopsy procedure.  He was instructed to proceed with echocardiogram afterward.  He also emphasized importance of completing the echocardiogram.  His echocardiogram 02/27/2022 showed an EF of 45-50% with no regional wall motion abnormalities, trivial MR and mild AI.  He was seen in follow-up by Azalee Course PA-C on 04/03/2022.  He had run out of his amiodarone, Xarelto, furosemide and carvedilol.  He has not been taking medications for approximately 1 month.  His medications were refilled.  He was given samples of Xarelto and medication assistance for Xarelto and Entresto.  Unfortunately even though he has been cleared for his prostate biopsy surgery he had scheduling conflict and his procedure was canceled.  He was encouraged to contact the urology office to reschedule the procedure.  Due to the need to  come off of Xarelto for 3 days for urology procedure his cardioversion was delayed for another 2 months.  Follow-up was planned for 2 months.  His EKG continued to show atrial flutter.  After reviewing Dr. Erin Hearing previous note it was felt that proceeding with cardioversion post prostate biopsy would be favored.  Assumption that he would be able to be compliant with his Xarelto medication was made.  Post  cardioversion recommendation for repeat coronary CT was discussed.  He presented to the clinic 06/05/22 for follow-up evaluation and stated he felt well.  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  He denied irregular or accelerated heart rates.  He denied being overly fatigued.  He reportd compliance with his medications.  His blood pressure was well-controlled at 124/80.  He reported that he had not yet had his prostate biopsy.  I encouraged him to contact the urologist to schedule his procedure.  He denied illicit drug use.  His EKG showed atrial flutter with variable AV block left ventricular hypertrophy with QRS widening and repolarization abnormality 72 bpm.  I continued his medication regimen, had him continue to increase/maintain his physical activity, avoid triggers, and planned follow-up for 2 months.  He contacted nurse triage line 04/11/2023 and reported episodes of chest discomfort x 2 weeks.  They would happen intermittently.  They were not associated with physical activity.  He denied nitroglycerin use and affiliated symptoms.  He reported pain was 3 out of 10.  He presents to the clinic today for follow-up evaluation and states***.  Today he denies chest pain, shortness of breath, lower extremity edema, fatigue, palpitations, melena, hematuria, hemoptysis, diaphoresis, weakness, presyncope, syncope, orthopnea, and PND.    Home Medications    Prior to Admission medications   Medication Sig Start Date End Date Taking? Authorizing Provider  amiodarone (PACERONE) 200 MG tablet Take 1 tablet (200 mg total) by mouth daily. 04/03/22   Azalee Course, PA  Ascorbic Acid (VITAMIN C PO) Take 1 tablet by mouth daily in the afternoon.    [provider]  aspirin EC 81 MG tablet Take 81 mg by mouth daily. Swallow whole.    [provider]  atorvastatin (LIPITOR) 40 MG tablet Take 1 tablet (40 mg total) by mouth at bedtime. 01/04/22     carvedilol (COREG) 12.5 MG tablet Take 1 tablet  (12.5 mg total) by mouth 2 (two) times daily with food. 04/03/22   Azalee Course, PA  carvedilol (COREG) 12.5 MG tablet Take 1 tablet (12.5 mg total) by mouth in the morning and 1 tablet in the evening with food. 05/30/22     Cholecalciferol (VITAMIN D3) 25 MCG (1000 UT) CAPS Take 1 capsule by mouth daily in the afternoon.    [provider]  dapagliflozin propanediol (FARXIGA) 10 MG TABS tablet Take 1 tablet (10 mg total) by mouth daily before breakfast. 12/26/21   Croitoru, Mihai, MD  furosemide (LASIX) 40 MG tablet Take 1 tablet (40 mg total) by mouth daily. 04/03/22   Azalee Course, PA  METAMUCIL FIBER PO Take 1 tablet by mouth daily.    [provider]  pantoprazole (PROTONIX) 40 MG tablet Take 1 tablet (40 mg total) by mouth 2 (two) times daily. 01/09/22     psyllium (REGULOID) 0.52 g capsule Take 0.52 g by mouth daily.    [provider]  rivaroxaban (XARELTO) 20 MG TABS tablet Take 1 tablet (20 mg total) by mouth daily with supper. 03/28/22   Croitoru, Rachelle Hora, MD  sacubitril-valsartan (  ENTRESTO) 49-51 MG Take 1 tablet by mouth 2 (two) times daily. 03/06/22   Croitoru, Mihai, MD  Semaglutide-Weight Management (WEGOVY) 0.25 MG/0.5ML SOAJ Inject 0.25 mg into the skin once a week for 4 weeks 05/23/22     spironolactone (ALDACTONE) 25 MG tablet Take 0.5 tablets (12.5 mg total) by mouth daily. 01/25/22   Masters, Katie, DO  tetracycline (SUMYCIN) 500 MG capsule Take 500 mg by mouth 4 (four) times daily.    [provider]    Family History    Family History  Problem Relation Age of Onset   Heart murmur Father    Hypertension Father    He indicated that his mother is deceased. He indicated that his father is deceased.  Social History    Social History   Socioeconomic History   Marital status: Married    Spouse name: Not on file   Number of children: Not on file   Years of education: Not on file   Highest education level: Not on file  Occupational History   Not  on file  Tobacco Use   Smoking status: Former    Current packs/day: 0.00    Types: Cigarettes    Start date: 05/15/2005    Quit date: 04/16/2017    Years since quitting: 5.9   Smokeless tobacco: Never  Substance and Sexual Activity   Alcohol use: Not on file   Drug use: Yes   Sexual activity: Not on file  Other Topics Concern   Not on file  Social History Narrative   ** Merged History Encounter **       Social Determinants of Health   Financial Resource Strain: Not on file  Food Insecurity: Not on file  Transportation Needs: Not on file  Physical Activity: Not on file  Stress: Not on file  Social Connections: Not on file  Intimate Partner Violence: Not on file     Review of Systems    General:  No chills, fever, night sweats or weight changes.  Cardiovascular:  No chest pain, dyspnea on exertion, edema, orthopnea, palpitations, paroxysmal nocturnal dyspnea. Dermatological: No rash, lesions/masses Respiratory: No cough, dyspnea Urologic: No hematuria, dysuria Abdominal:   No nausea, vomiting, diarrhea, bright red blood per rectum, melena, or hematemesis Neurologic:  No visual changes, wkns, changes in mental status. All other systems reviewed and are otherwise negative except as noted above.  Physical Exam    VS:  There were no vitals taken for this visit. , BMI There is no height or weight on file to calculate BMI. GEN: Well nourished, well developed, in no acute distress. HEENT: normal. Neck: Supple, no JVD, carotid bruits, or masses. Cardiac: Atrial flutter with variable AV block, no murmurs, rubs, or gallops. No clubbing, cyanosis, edema.  Radials/DP/PT 2+ and equal bilaterally.  Respiratory:  Respirations regular and unlabored, clear to auscultation bilaterally. GI: Soft, nontender, nondistended, BS + x 4. MS: no deformity or atrophy. Skin: warm and dry, no rash. Neuro:  Strength and sensation are intact. Psych: Normal affect.  Accessory Clinical Findings     Recent Labs: No results found for requested labs within last 365 days.   Recent Lipid Panel    Component Value Date/Time   CHOL 120 03/31/2021 1019   TRIG 102 03/31/2021 1019   HDL 53 03/31/2021 1019   CHOLHDL 2.3 03/31/2021 1019   LDLCALC 48 03/31/2021 1019    No BP recorded.  {Refresh Note OR Click here to enter BP  :1}***  ECG personally reviewed by me today-***  EKG 06/05/2022 atrial flutter with variable AV block left ventricular hypertrophy with QRS widening and repolarization abnormality QT prolongation 72 bpm  Echocardiogram 02/27/2022   1. Posterior lateral hypokinesis . Left ventricular ejection fraction, by  estimation, is 45 to 50%. The left ventricle has mildly decreased  function. The left ventricle has no regional wall motion abnormalities.  The left ventricular internal cavity size   was mildly dilated. Left ventricular diastolic parameters are  indeterminate.   2. Right ventricular systolic function is normal. The right ventricular  size is normal.   3. Left atrial size was moderately dilated.   4. The mitral valve is abnormal. Trivial mitral valve regurgitation. No  evidence of mitral stenosis.   5. The aortic valve is tricuspid. There is mild calcification of the  aortic valve. There is mild thickening of the aortic valve. Aortic valve  regurgitation is mild. Aortic valve sclerosis is present, with no evidence  of aortic valve stenosis.   6. The inferior vena cava is normal in size with greater than 50%  respiratory variability, suggesting right atrial pressure of 3 mmHg.   CHA2DS2-VASc Score = 4   This indicates a 4.8% annual risk of stroke. The patient's score is based upon: CHF History: 1 HTN History: 1 Diabetes History: 0 Stroke History: 0 Vascular Disease History: 1 Age Score: 1 Gender Score: 0  Assessment & Plan   1.  Chest discomfort-EKG today shows***.  Reports 2 weeks of intermittent chest discomfort that is nonexertional.  Reported  pain was 3 out of 10.  Pain was happening daily.  Symptoms appear to be precordial type chest discomfort? Order CBC, BMP  Essential hypertension-BP today 124/***80 Continue to maintain blood pressure log Heart healthy low-sodium diet-salty 6 reviewed Continue Entresto, carvedilol, furosemide  Atrial flutter-heart rate today***.  Plan was previously to complete cardioversion after prostate biopsy.  However, patient has been in atrial flutter for several months.  He wishes to proceed with rate control therapy.   Continue Xarelto, carvedilol,?  Amiodarone Avoid triggers-caffeine, chocolate, EtOH, dehydration etc.  Coronary artery disease-no chest pain today.   Continue current medical therapy Heart healthy low-sodium diet Maintain physical activity  Chronic systolic CHF-weight stable.  Physical activity and breathing at baseline.  Echocardiogram showed EF 45-50% on 02/27/2022.   Continue furosemide, carvedilol, Entresto Maintain physical activity as tolerated.  History of noncompliance-previously noted to be noncompliant with his medications however he had refills of his medication left.  Previously indicated that he had not taken his blood thinner for about 1 month.   Reviewed importance of medication compliance  Patient encouraged to contact urol***ogy to schedule prostate biopsy.  Again explained to patient that we would proceed with cardioversion post prostate biopsy.  He expressed understanding.  Disposition: Follow-up with Dr. [name] or Azalee Course PA-C in 2-3 months.   Thomasene Ripple. Zlatan Hornback NP-C     04/12/2023, 6:38 AM [facility] [address] Office [phone] Fax [phone]    I spent 14***minutes examining this patient, reviewing medications, and using patient centered shared decision making involving her cardiac care.  Prior to her visit I spent greater than 20 minutes reviewing her past medical history,  medications, and prior  cardiac tests.

## 2023-04-13 ENCOUNTER — Ambulatory Visit: Payer: Medicare HMO | Admitting: General Practice

## 2023-04-27 ENCOUNTER — Other Ambulatory Visit (HOSPITAL_COMMUNITY): Payer: Self-pay

## 2023-04-28 NOTE — Progress Notes (Unsigned)
Cardiology Office Note    Date:  04/28/2023  ID:  Roger Humphrey, DOB 1955-08-27, MRN [id number] PCP:  No primary care provider on file.  Cardiologist:  Thurmon Fair, MD  Electrophysiologist:  None   Chief Complaint: ***  History of Present Illness: Roger Humphrey    Roger Humphrey is a 67 y.o. male with visit-pertinent history of CAD, paroxysmal atrial flutter on Eliquis, hypertension and history of chronic systolic heart failure.  In November 2018 he presented with acute pulmonary edema that occurred in the setting of hypertensive emergency and new onset atrial flutter.  He underwent TEE/DCCV at that time.  EF was 30 to 35%, medical therapy with beta-blocker and ARB were limited due to AKI and at that time recent cocaine use.  In November 2018 he underwent CTA which showed evidence of coronary artery disease, occluded large ramus intermedius, high-grade stenosis in distal small left circumflex artery with FFR less than 0.5, moderate diffuse stenosis in mid LAD 50 to 60%, FFR was 0.75, small secundum arterial septal defect was reported.  He was then lost to follow-up from our practice.  In October 2022 he presented to the emergency room with rapid atrial flutter and severe hypertension.  EKG indicated atrial flutter with mostly 3-1 AV block, he was cocaine positive.  His BNP was elevated at 324, troponin 37>>38. CTA was recommended to exclude PE, patient refused and elected to go home.  He followed up with internal medicine service in 03/2021, it was noted he had an irregular heartbeat on exam, he was seen by Dr. [name] and was started on Entresto, carvedilol, spironolactone, Farxiga, Lasix and amiodarone.  He then elected to switch back to heart care.  He was seen by Dr. [name] in 11/2021, with concerns for noncompliance, he did deny any use of cocaine for the prior 6 months.  Dr. [name] recommended proceeding with outpatient cardioversion and echocardiogram, cardioversion was later canceled and echo  was not completed.  He was then seen for preoperative cardiac exam, he was instructed to complete his echocardiogram following his procedure.  Echo on 02/27/2022 showed LVEF of 45 to 50%, no RWMA, moderate LAE, normal right atrial size, trivial MR and mild AI.  His prior procedure, prostate biopsy was canceled by patient given timing conflicts.  He planed to undergo procedure and will need to hold Xarelto for 3 days, therefore his cardioversion was delayed for another 2 months, as patient also reported that he had run out of his amiodarone, Xarelto, furosemide and carvedilol.  He was last seen in clinic on 06/05/2022 by [name], NP.  He had not yet undergone his prostate biopsy.  He denied any illicit drug use.  As he was still planning to have his prostate biopsy was recommended that he follow-up again in 2 months to reconsider DCCV.  Today he presents for follow up. He reports that he    Atrial flutter: CAD:  Hypertension:  Chronic systolic HF: History of noncompliance:  Labwork independently reviewed:   ROS: .    Please see the history of present illness. Otherwise, review of systems is positive for ***.  All other systems are reviewed and otherwise negative.  Studies Reviewed: Roger Humphrey    EKG:  EKG is ordered today, personally reviewed, demonstrating ***  CV Studies: Cardiac studies reviewed are outlined and summarized above. Otherwise please see EMR for full report.   Current Reported Medications:.    No outpatient medications have been marked as taking for the 05/01/23 encounter (Appointment) with  [name], NP.    Physical Exam:    VS:  There were no vitals taken for this visit.   Wt Readings from Last 3 Encounters:  06/05/22 279 lb (126.6 kg)  04/03/22 267 lb 9.6 oz (121.4 kg)  01/31/22 263 lb 6.4 oz (119.5 kg)    GEN: Well nourished, well developed in no acute distress NECK: No JVD; No carotid bruits CARDIAC: ***RRR, no murmurs, rubs, gallops RESPIRATORY:  Clear to  auscultation without rales, wheezing or rhonchi  ABDOMEN: Soft, non-tender, non-distended EXTREMITIES:  No edema; No acute deformity   Asessement and Plan:.     ***     Disposition: F/u with ***  Signed, Rip Harbour, NP

## 2023-05-01 ENCOUNTER — Ambulatory Visit: Payer: Medicare HMO | Admitting: Cardiology

## 2023-05-01 DIAGNOSIS — I5022 Chronic systolic (congestive) heart failure: Secondary | ICD-10-CM

## 2023-05-01 DIAGNOSIS — Z91199 Patient's noncompliance with other medical treatment and regimen due to unspecified reason: Secondary | ICD-10-CM

## 2023-05-01 DIAGNOSIS — I2510 Atherosclerotic heart disease of native coronary artery without angina pectoris: Secondary | ICD-10-CM

## 2023-05-01 DIAGNOSIS — I483 Typical atrial flutter: Secondary | ICD-10-CM

## 2023-05-01 DIAGNOSIS — Z79899 Other long term (current) drug therapy: Secondary | ICD-10-CM

## 2023-05-01 DIAGNOSIS — I10 Essential (primary) hypertension: Secondary | ICD-10-CM

## 2023-05-14 ENCOUNTER — Other Ambulatory Visit (HOSPITAL_COMMUNITY): Payer: Self-pay

## 2023-05-21 ENCOUNTER — Other Ambulatory Visit: Payer: Self-pay

## 2023-05-21 ENCOUNTER — Other Ambulatory Visit (HOSPITAL_COMMUNITY): Payer: Self-pay

## 2023-05-22 ENCOUNTER — Other Ambulatory Visit (HOSPITAL_COMMUNITY): Payer: Self-pay

## 2023-06-03 ENCOUNTER — Other Ambulatory Visit: Payer: Self-pay | Admitting: Cardiovascular Disease

## 2023-06-04 ENCOUNTER — Other Ambulatory Visit: Payer: Self-pay

## 2023-06-04 MED ORDER — AMIODARONE HCL 200 MG PO TABS
200.0000 mg | ORAL_TABLET | Freq: Every day | ORAL | 3 refills | Status: DC
  Filled 2023-09-18 – 2023-12-21 (×3): qty 90, 90d supply, fill #0
  Filled 2024-04-04: qty 90, 90d supply, fill #1

## 2023-06-07 MED ORDER — CARVEDILOL 12.5 MG PO TABS: 12.5000 mg | ORAL_TABLET | Freq: Two times a day (BID) | ORAL | 0 refills | Status: DC

## 2023-06-07 NOTE — Telephone Encounter (Signed)
 Sent a 90 day supply prescription for carvedilol 12.5 mg tablets BID because of preferred pharmacy policy and upcoming follow up appointment with Dr. [name] on 07/20/2023.  Josie, LPN

## 2023-06-15 ENCOUNTER — Other Ambulatory Visit (HOSPITAL_COMMUNITY): Payer: Self-pay

## 2023-06-15 MED ORDER — XARELTO 20 MG PO TABS
20.0000 mg | ORAL_TABLET | Freq: Every day | ORAL | 3 refills | Status: AC
  Filled 2023-06-15 – 2024-05-07 (×2): qty 90, 90d supply, fill #0

## 2023-06-22 ENCOUNTER — Other Ambulatory Visit (HOSPITAL_COMMUNITY): Payer: Self-pay

## 2023-06-22 MED ORDER — DAPAGLIFLOZIN PROPANEDIOL 10 MG PO TABS
10.0000 mg | ORAL_TABLET | Freq: Every day | ORAL | 0 refills | Status: DC
  Filled 2023-06-22: qty 30, 30d supply, fill #0

## 2023-06-22 MED ORDER — RIVAROXABAN 20 MG PO TABS
20.0000 mg | ORAL_TABLET | Freq: Every day | ORAL | 0 refills | Status: AC
  Filled 2023-06-22: qty 30, 30d supply, fill #0

## 2023-06-22 MED ORDER — ATORVASTATIN CALCIUM 40 MG PO TABS
40.0000 mg | ORAL_TABLET | Freq: Every day | ORAL | 3 refills | Status: AC
  Filled 2023-06-22: qty 90, 90d supply, fill #0

## 2023-07-10 ENCOUNTER — Other Ambulatory Visit (HOSPITAL_COMMUNITY): Payer: Self-pay

## 2023-07-20 ENCOUNTER — Ambulatory Visit: Payer: Medicare HMO | Admitting: Cardiovascular Disease

## 2023-07-29 ENCOUNTER — Other Ambulatory Visit (HOSPITAL_COMMUNITY): Payer: Self-pay

## 2023-07-30 ENCOUNTER — Other Ambulatory Visit (HOSPITAL_COMMUNITY): Payer: Self-pay

## 2023-07-30 MED ORDER — DAPAGLIFLOZIN PROPANEDIOL 10 MG PO TABS
10.0000 mg | ORAL_TABLET | Freq: Every day | ORAL | 2 refills | Status: AC
Start: 2023-07-29 — End: ?
  Filled 2023-07-30 – 2024-03-19 (×3): qty 90, 90d supply, fill #0

## 2023-08-02 ENCOUNTER — Other Ambulatory Visit: Payer: Self-pay | Admitting: Cardiovascular Disease

## 2023-08-09 ENCOUNTER — Other Ambulatory Visit (HOSPITAL_COMMUNITY): Payer: Self-pay

## 2023-08-18 ENCOUNTER — Other Ambulatory Visit (HOSPITAL_BASED_OUTPATIENT_CLINIC_OR_DEPARTMENT_OTHER): Payer: Self-pay

## 2023-08-20 ENCOUNTER — Other Ambulatory Visit (HOSPITAL_COMMUNITY): Payer: Self-pay

## 2023-08-30 ENCOUNTER — Other Ambulatory Visit (HOSPITAL_COMMUNITY): Payer: Self-pay

## 2023-09-05 ENCOUNTER — Ambulatory Visit: Payer: Medicare HMO | Attending: Cardiovascular Disease | Admitting: Cardiovascular Disease

## 2023-09-05 DIAGNOSIS — I5022 Chronic systolic (congestive) heart failure: Secondary | ICD-10-CM

## 2023-09-05 DIAGNOSIS — I10 Essential (primary) hypertension: Secondary | ICD-10-CM

## 2023-09-05 DIAGNOSIS — I483 Typical atrial flutter: Secondary | ICD-10-CM

## 2023-09-06 ENCOUNTER — Emergency Department (HOSPITAL_COMMUNITY)

## 2023-09-06 ENCOUNTER — Emergency Department (HOSPITAL_COMMUNITY)
Admission: EM | Admit: 2023-09-06 | Discharge: 2023-09-06 | Disposition: A | Discharge status: Home / Self Care | Attending: Emergency Medicine | Admitting: Emergency Medicine

## 2023-09-06 ENCOUNTER — Other Ambulatory Visit: Payer: Self-pay

## 2023-09-06 DIAGNOSIS — I509 Heart failure, unspecified: Secondary | ICD-10-CM | POA: Insufficient documentation

## 2023-09-06 DIAGNOSIS — I110 Hypertensive heart disease with heart failure: Secondary | ICD-10-CM | POA: Diagnosis not present

## 2023-09-06 DIAGNOSIS — Z7901 Long term (current) use of anticoagulants: Secondary | ICD-10-CM | POA: Insufficient documentation

## 2023-09-06 DIAGNOSIS — M25512 Pain in left shoulder: Secondary | ICD-10-CM | POA: Diagnosis present

## 2023-09-06 DIAGNOSIS — Z7982 Long term (current) use of aspirin: Secondary | ICD-10-CM | POA: Insufficient documentation

## 2023-09-06 DIAGNOSIS — Y92410 Unspecified street and highway as the place of occurrence of the external cause: Secondary | ICD-10-CM | POA: Insufficient documentation

## 2023-09-06 DIAGNOSIS — Z79899 Other long term (current) drug therapy: Secondary | ICD-10-CM | POA: Insufficient documentation

## 2023-09-06 MED ORDER — ACETAMINOPHEN 500 MG PO TABS
1000.0000 mg | ORAL_TABLET | Freq: Once | ORAL | Status: AC
  Administered 2023-09-06: 1000 mg via ORAL
  Filled 2023-09-06: qty 2
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
## 2023-09-18 ENCOUNTER — Other Ambulatory Visit (HOSPITAL_COMMUNITY): Payer: Self-pay

## 2023-09-28 ENCOUNTER — Other Ambulatory Visit (HOSPITAL_COMMUNITY): Payer: Self-pay

## 2023-10-08 ENCOUNTER — Other Ambulatory Visit (HOSPITAL_COMMUNITY): Payer: Self-pay

## 2023-10-15 ENCOUNTER — Other Ambulatory Visit (HOSPITAL_COMMUNITY): Payer: Self-pay

## 2023-10-19 ENCOUNTER — Other Ambulatory Visit (HOSPITAL_COMMUNITY): Payer: Self-pay

## 2023-10-30 ENCOUNTER — Other Ambulatory Visit (HOSPITAL_COMMUNITY): Payer: Self-pay

## 2023-10-31 ENCOUNTER — Other Ambulatory Visit (HOSPITAL_COMMUNITY): Payer: Self-pay

## 2023-10-31 ENCOUNTER — Telehealth: Payer: Self-pay | Admitting: Cardiovascular Disease

## 2023-10-31 ENCOUNTER — Other Ambulatory Visit: Payer: Self-pay | Admitting: Cardiovascular Disease

## 2023-10-31 MED ORDER — CARVEDILOL 12.5 MG PO TABS
12.5000 mg | ORAL_TABLET | Freq: Two times a day (BID) | ORAL | 0 refills | Status: DC
  Filled 2023-10-31: qty 60, 30d supply, fill #0

## 2023-10-31 NOTE — Telephone Encounter (Signed)
 RX sent to requested Pharmacy

## 2023-10-31 NOTE — Telephone Encounter (Signed)
*  STAT* If patient is at the pharmacy, call can be transferred to refill team.   1. Which medications need to be refilled? (please list name of each medication and dose if known)  carvedilol  (COREG ) 12.5 MG tablet   2. Which pharmacy/location (including street and city if local pharmacy) is medication to be sent to? [place] [facility]   3. Do they need a 30 day or 90 day supply?  30 day supply

## 2023-11-02 ENCOUNTER — Other Ambulatory Visit (HOSPITAL_COMMUNITY): Payer: Self-pay

## 2023-11-05 ENCOUNTER — Ambulatory Visit: Admitting: Nurse Practitioner
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
## 2023-11-22 ENCOUNTER — Other Ambulatory Visit: Payer: Self-pay

## 2023-11-22 ENCOUNTER — Other Ambulatory Visit (HOSPITAL_COMMUNITY): Payer: Self-pay

## 2023-11-22 ENCOUNTER — Other Ambulatory Visit: Payer: Self-pay | Admitting: Cardiovascular Disease

## 2023-11-22 MED ORDER — PANTOPRAZOLE SODIUM 40 MG PO TBEC
40.0000 mg | DELAYED_RELEASE_TABLET | Freq: Every day | ORAL | 0 refills | Status: AC
  Filled 2023-11-22: qty 90, 90d supply, fill #0

## 2023-11-23 ENCOUNTER — Other Ambulatory Visit (HOSPITAL_COMMUNITY): Payer: Self-pay

## 2023-11-23 ENCOUNTER — Other Ambulatory Visit (HOSPITAL_BASED_OUTPATIENT_CLINIC_OR_DEPARTMENT_OTHER): Payer: Self-pay

## 2023-11-23 MED ORDER — CARVEDILOL 12.5 MG PO TABS
12.5000 mg | ORAL_TABLET | Freq: Two times a day (BID) | ORAL | 0 refills | Status: DC
  Filled 2023-11-23: qty 60, 30d supply, fill #0

## 2023-12-01 ENCOUNTER — Other Ambulatory Visit (HOSPITAL_COMMUNITY): Payer: Self-pay

## 2023-12-03 ENCOUNTER — Other Ambulatory Visit: Payer: Self-pay

## 2023-12-13 ENCOUNTER — Other Ambulatory Visit (HOSPITAL_COMMUNITY): Payer: Self-pay

## 2023-12-21 ENCOUNTER — Other Ambulatory Visit (HOSPITAL_COMMUNITY): Payer: Self-pay

## 2023-12-21 MED ORDER — FUROSEMIDE 40 MG PO TABS
40.0000 mg | ORAL_TABLET | Freq: Every day | ORAL | 0 refills | Status: DC
  Filled 2023-12-21: qty 90, 90d supply, fill #0

## 2023-12-21 MED ORDER — PANTOPRAZOLE SODIUM 20 MG PO TBEC
20.0000 mg | DELAYED_RELEASE_TABLET | ORAL | 0 refills | Status: DC
  Filled 2023-12-21: qty 45, 90d supply, fill #0
  Filled 2024-04-24: qty 45, 90d supply, fill #1

## 2023-12-29 ENCOUNTER — Other Ambulatory Visit (HOSPITAL_COMMUNITY): Payer: Self-pay

## 2023-12-31 ENCOUNTER — Other Ambulatory Visit (HOSPITAL_COMMUNITY): Payer: Self-pay

## 2024-01-02 ENCOUNTER — Other Ambulatory Visit (HOSPITAL_COMMUNITY): Payer: Self-pay

## 2024-01-02 MED ORDER — RIVAROXABAN 20 MG PO TABS
20.0000 mg | ORAL_TABLET | Freq: Every day | ORAL | 0 refills | Status: AC
  Filled 2024-01-02: qty 90, 90d supply, fill #0

## 2024-01-04 ENCOUNTER — Other Ambulatory Visit (HOSPITAL_COMMUNITY): Payer: Self-pay

## 2024-01-04 ENCOUNTER — Other Ambulatory Visit: Payer: Self-pay | Admitting: Cardiovascular Disease

## 2024-01-04 MED ORDER — CARVEDILOL 12.5 MG PO TABS
12.5000 mg | ORAL_TABLET | Freq: Two times a day (BID) | ORAL | 0 refills | Status: DC
  Filled 2024-01-04: qty 30, 15d supply, fill #0

## 2024-01-09 ENCOUNTER — Other Ambulatory Visit (HOSPITAL_COMMUNITY): Payer: Self-pay

## 2024-01-09 MED ORDER — ATORVASTATIN CALCIUM 40 MG PO TABS
40.0000 mg | ORAL_TABLET | Freq: Every day | ORAL | 0 refills | Status: AC
  Filled 2024-01-09: qty 90, 90d supply, fill #0

## 2024-01-10 ENCOUNTER — Other Ambulatory Visit (HOSPITAL_COMMUNITY): Payer: Self-pay

## 2024-01-10 MED ORDER — ATORVASTATIN CALCIUM 40 MG PO TABS
40.0000 mg | ORAL_TABLET | Freq: Every day | ORAL | 3 refills | Status: AC
  Filled 2024-01-10: qty 90, 90d supply, fill #0

## 2024-01-25 ENCOUNTER — Other Ambulatory Visit: Payer: Self-pay | Admitting: Cardiovascular Disease

## 2024-01-29 ENCOUNTER — Other Ambulatory Visit (HOSPITAL_COMMUNITY): Payer: Self-pay

## 2024-01-29 MED ORDER — CARVEDILOL 12.5 MG PO TABS
12.5000 mg | ORAL_TABLET | Freq: Two times a day (BID) | ORAL | 0 refills | Status: DC
  Filled 2024-01-29: qty 30, 15d supply, fill #0

## 2024-02-15 ENCOUNTER — Other Ambulatory Visit: Payer: Self-pay | Admitting: Cardiovascular Disease

## 2024-02-18 ENCOUNTER — Other Ambulatory Visit (HOSPITAL_COMMUNITY): Payer: Self-pay

## 2024-02-18 MED ORDER — CARVEDILOL 12.5 MG PO TABS
12.5000 mg | ORAL_TABLET | Freq: Two times a day (BID) | ORAL | 0 refills | Status: DC
  Filled 2024-02-18: qty 30, 15d supply, fill #0

## 2024-02-28 ENCOUNTER — Other Ambulatory Visit: Payer: Self-pay

## 2024-02-28 ENCOUNTER — Other Ambulatory Visit: Payer: Self-pay | Admitting: General Practice

## 2024-02-29 ENCOUNTER — Other Ambulatory Visit: Payer: Self-pay | Admitting: Student

## 2024-02-29 ENCOUNTER — Other Ambulatory Visit (HOSPITAL_COMMUNITY): Payer: Self-pay

## 2024-02-29 MED ORDER — XARELTO 20 MG PO TABS
20.0000 mg | ORAL_TABLET | Freq: Every evening | ORAL | 3 refills | Status: AC
  Filled 2024-02-29: qty 90, 90d supply, fill #0

## 2024-02-29 MED ORDER — SPIRONOLACTONE 25 MG PO TABS
12.5000 mg | ORAL_TABLET | Freq: Every day | ORAL | 3 refills | Status: AC
  Filled 2024-02-29: qty 45, 90d supply, fill #0
  Filled 2024-02-29: qty 90, 90d supply, fill #0

## 2024-02-29 MED ORDER — ATORVASTATIN CALCIUM 40 MG PO TABS
40.0000 mg | ORAL_TABLET | Freq: Every day | ORAL | 3 refills | Status: AC
  Filled 2024-02-29 – 2024-03-24 (×3): qty 90, 90d supply, fill #0

## 2024-02-29 MED ORDER — CARVEDILOL 12.5 MG PO TABS
12.5000 mg | ORAL_TABLET | Freq: Two times a day (BID) | ORAL | 3 refills | Status: AC
  Filled 2024-02-29 – 2024-03-04 (×2): qty 180, 90d supply, fill #0
  Filled 2024-06-11: qty 180, 90d supply, fill #1

## 2024-02-29 NOTE — Telephone Encounter (Signed)
 No longer [facility] patient.

## 2024-03-04 ENCOUNTER — Other Ambulatory Visit (HOSPITAL_COMMUNITY): Payer: Self-pay

## 2024-03-05 ENCOUNTER — Other Ambulatory Visit (HOSPITAL_COMMUNITY): Payer: Self-pay

## 2024-03-19 ENCOUNTER — Other Ambulatory Visit (HOSPITAL_COMMUNITY): Payer: Self-pay

## 2024-03-24 ENCOUNTER — Other Ambulatory Visit (HOSPITAL_COMMUNITY): Payer: Self-pay

## 2024-03-29 ENCOUNTER — Other Ambulatory Visit (HOSPITAL_COMMUNITY): Payer: Self-pay

## 2024-03-31 ENCOUNTER — Other Ambulatory Visit (HOSPITAL_COMMUNITY): Payer: Self-pay

## 2024-03-31 MED ORDER — FUROSEMIDE 40 MG PO TABS
40.0000 mg | ORAL_TABLET | Freq: Every day | ORAL | 2 refills | Status: AC
  Filled 2024-03-31: qty 90, 90d supply, fill #0
  Filled 2024-06-26: qty 90, 90d supply, fill #1

## 2024-04-24 ENCOUNTER — Other Ambulatory Visit (HOSPITAL_COMMUNITY): Payer: Self-pay

## 2024-04-28 ENCOUNTER — Other Ambulatory Visit (HOSPITAL_COMMUNITY): Payer: Self-pay

## 2024-04-29 ENCOUNTER — Other Ambulatory Visit (HOSPITAL_COMMUNITY): Payer: Self-pay

## 2024-05-02 ENCOUNTER — Other Ambulatory Visit (HOSPITAL_COMMUNITY): Payer: Self-pay

## 2024-05-07 ENCOUNTER — Other Ambulatory Visit (HOSPITAL_COMMUNITY): Payer: Self-pay

## 2024-05-08 ENCOUNTER — Other Ambulatory Visit (HOSPITAL_COMMUNITY): Payer: Self-pay

## 2024-06-11 ENCOUNTER — Other Ambulatory Visit (HOSPITAL_COMMUNITY): Payer: Self-pay

## 2024-06-12 ENCOUNTER — Other Ambulatory Visit: Payer: Self-pay

## 2024-06-12 ENCOUNTER — Other Ambulatory Visit (HOSPITAL_COMMUNITY): Payer: Self-pay

## 2024-06-12 MED ORDER — SPIRONOLACTONE 25 MG PO TABS
12.5000 mg | ORAL_TABLET | Freq: Every day | ORAL | 3 refills | Status: AC
  Filled 2024-06-12: qty 45, 90d supply, fill #0

## 2024-06-12 MED ORDER — CARVEDILOL 12.5 MG PO TABS
12.5000 mg | ORAL_TABLET | Freq: Two times a day (BID) | ORAL | 3 refills | Status: AC
  Filled 2024-06-12: qty 180, 90d supply, fill #0

## 2024-06-12 MED ORDER — PANTOPRAZOLE SODIUM 20 MG PO TBEC
20.0000 mg | DELAYED_RELEASE_TABLET | ORAL | 2 refills | Status: AC
  Filled 2024-06-12: qty 45, 90d supply, fill #0
  Filled 2024-06-17 – 2024-07-03 (×4): qty 90, 180d supply, fill #0

## 2024-06-17 ENCOUNTER — Other Ambulatory Visit (HOSPITAL_COMMUNITY): Payer: Self-pay

## 2024-06-26 ENCOUNTER — Other Ambulatory Visit: Payer: Self-pay

## 2024-06-26 ENCOUNTER — Other Ambulatory Visit (HOSPITAL_COMMUNITY): Payer: Self-pay

## 2024-06-27 ENCOUNTER — Other Ambulatory Visit: Payer: Self-pay | Admitting: Physician Assistant

## 2024-06-27 ENCOUNTER — Other Ambulatory Visit (HOSPITAL_COMMUNITY): Payer: Self-pay

## 2024-06-27 DIAGNOSIS — I4892 Unspecified atrial flutter: Secondary | ICD-10-CM

## 2024-06-27 MED ORDER — AMIODARONE HCL 200 MG PO TABS
200.0000 mg | ORAL_TABLET | Freq: Every day | ORAL | 0 refills | Status: AC
  Filled 2024-06-27: qty 30, 30d supply, fill #0

## 2024-06-30 ENCOUNTER — Other Ambulatory Visit (HOSPITAL_COMMUNITY): Payer: Self-pay

## 2024-07-03 ENCOUNTER — Other Ambulatory Visit (HOSPITAL_COMMUNITY): Payer: Self-pay
